# Patient Record
Sex: Female | Born: 1975 | Race: White | Hispanic: No | Smoking: Former smoker
Health system: Southern US, Community
[De-identification: ages and names within clinical notes are randomized; demographics above are authoritative.]

## PROBLEM LIST (undated history)

## (undated) DIAGNOSIS — R51 Headache: Secondary | ICD-10-CM

## (undated) DIAGNOSIS — F329 Major depressive disorder, single episode, unspecified: Secondary | ICD-10-CM

## (undated) DIAGNOSIS — R87619 Unspecified abnormal cytological findings in specimens from cervix uteri: Secondary | ICD-10-CM

## (undated) DIAGNOSIS — G43909 Migraine, unspecified, not intractable, without status migrainosus: Secondary | ICD-10-CM

## (undated) DIAGNOSIS — IMO0002 Reserved for concepts with insufficient information to code with codable children: Secondary | ICD-10-CM

## (undated) DIAGNOSIS — F32A Depression, unspecified: Secondary | ICD-10-CM

## (undated) DIAGNOSIS — B009 Herpesviral infection, unspecified: Secondary | ICD-10-CM

## (undated) DIAGNOSIS — M1990 Unspecified osteoarthritis, unspecified site: Secondary | ICD-10-CM

## (undated) HISTORY — DX: Migraine, unspecified, not intractable, without status migrainosus: G43.909

## (undated) HISTORY — PX: OTHER SURGICAL HISTORY: SHX169

---

## 1997-03-21 ENCOUNTER — Inpatient Hospital Stay (HOSPITAL_COMMUNITY): Admission: AD | Admit: 1997-03-21 | Discharge: 1997-03-23 | Payer: Self-pay | Admitting: *Deleted

## 1997-03-21 ENCOUNTER — Encounter (HOSPITAL_COMMUNITY): Admission: RE | Admit: 1997-03-21 | Discharge: 1997-03-22 | Payer: Self-pay | Admitting: *Deleted

## 1997-04-21 ENCOUNTER — Encounter (HOSPITAL_COMMUNITY): Admission: RE | Admit: 1997-04-21 | Discharge: 1997-07-20 | Payer: Self-pay | Admitting: *Deleted

## 1999-07-28 ENCOUNTER — Inpatient Hospital Stay (HOSPITAL_COMMUNITY): Admission: AD | Admit: 1999-07-28 | Discharge: 1999-07-31 | Payer: Self-pay | Admitting: Obstetrics & Gynecology

## 1999-09-07 ENCOUNTER — Other Ambulatory Visit: Admission: RE | Admit: 1999-09-07 | Discharge: 1999-09-07 | Payer: Self-pay | Admitting: Obstetrics & Gynecology

## 2002-11-26 ENCOUNTER — Encounter (INDEPENDENT_AMBULATORY_CARE_PROVIDER_SITE_OTHER): Payer: Self-pay | Admitting: *Deleted

## 2002-11-26 ENCOUNTER — Encounter: Admission: RE | Admit: 2002-11-26 | Discharge: 2002-11-26 | Payer: Self-pay | Admitting: Obstetrics and Gynecology

## 2004-05-18 ENCOUNTER — Encounter (INDEPENDENT_AMBULATORY_CARE_PROVIDER_SITE_OTHER): Payer: Self-pay | Admitting: Specialist

## 2004-05-18 ENCOUNTER — Ambulatory Visit: Payer: Self-pay | Admitting: Obstetrics and Gynecology

## 2004-06-22 ENCOUNTER — Other Ambulatory Visit: Admission: RE | Admit: 2004-06-22 | Discharge: 2004-06-22 | Payer: Self-pay | Admitting: Obstetrics and Gynecology

## 2004-06-22 ENCOUNTER — Ambulatory Visit: Payer: Self-pay | Admitting: Obstetrics and Gynecology

## 2004-07-06 ENCOUNTER — Ambulatory Visit: Payer: Self-pay | Admitting: Obstetrics and Gynecology

## 2004-12-16 ENCOUNTER — Encounter (INDEPENDENT_AMBULATORY_CARE_PROVIDER_SITE_OTHER): Payer: Self-pay | Admitting: *Deleted

## 2004-12-16 ENCOUNTER — Ambulatory Visit: Payer: Self-pay | Admitting: Family Medicine

## 2005-02-18 ENCOUNTER — Ambulatory Visit: Payer: Self-pay | Admitting: Family Medicine

## 2005-03-25 ENCOUNTER — Ambulatory Visit: Payer: Self-pay | Admitting: Gynecology

## 2005-05-25 ENCOUNTER — Ambulatory Visit: Payer: Self-pay | Admitting: Obstetrics & Gynecology

## 2005-10-13 ENCOUNTER — Ambulatory Visit: Payer: Self-pay | Admitting: Obstetrics & Gynecology

## 2006-03-10 ENCOUNTER — Encounter (INDEPENDENT_AMBULATORY_CARE_PROVIDER_SITE_OTHER): Payer: Self-pay | Admitting: Specialist

## 2006-03-10 ENCOUNTER — Encounter: Payer: Self-pay | Admitting: Obstetrics and Gynecology

## 2006-03-10 ENCOUNTER — Ambulatory Visit: Payer: Self-pay | Admitting: Obstetrics & Gynecology

## 2006-04-05 ENCOUNTER — Ambulatory Visit: Payer: Self-pay | Admitting: Obstetrics and Gynecology

## 2006-08-30 ENCOUNTER — Encounter: Payer: Self-pay | Admitting: Obstetrics and Gynecology

## 2006-08-30 ENCOUNTER — Ambulatory Visit: Payer: Self-pay | Admitting: Obstetrics and Gynecology

## 2007-01-16 ENCOUNTER — Ambulatory Visit: Payer: Self-pay | Admitting: Obstetrics and Gynecology

## 2007-09-06 ENCOUNTER — Inpatient Hospital Stay (HOSPITAL_COMMUNITY): Admission: AD | Admit: 2007-09-06 | Discharge: 2007-09-06 | Payer: Self-pay | Admitting: Obstetrics and Gynecology

## 2007-09-21 ENCOUNTER — Inpatient Hospital Stay (HOSPITAL_COMMUNITY): Admission: AD | Admit: 2007-09-21 | Discharge: 2007-09-23 | Payer: Self-pay | Admitting: Obstetrics and Gynecology

## 2008-11-13 ENCOUNTER — Encounter: Payer: Self-pay | Admitting: Obstetrics and Gynecology

## 2008-11-13 ENCOUNTER — Ambulatory Visit: Payer: Self-pay | Admitting: Obstetrics and Gynecology

## 2010-01-17 NOTE — L&D Delivery Note (Signed)
Delivery Note At 2:36 PM a viable female was preciptously delivered by the RN.  MD here for placental delivery and cord blood collection. APGAR: 8, 9; weight 6 lb 9.6 oz (2995 g).   Placenta status: delivered , .  Cord: 3 vessels with the following complications: None.  Anesthesia:  none Episiotomy: none   Lacerations: none Suture Repair: N/A Est. Blood Loss (mL): 500  Mom to postpartum.  Baby to nursery-stable.  BOVARD,Amaiah Cristiano 01/06/2011, 2:58 PM  Br/Mirena IUD/O+/RI

## 2010-05-26 ENCOUNTER — Inpatient Hospital Stay (HOSPITAL_COMMUNITY)
Admission: AD | Admit: 2010-05-26 | Discharge: 2010-05-27 | Disposition: A | Discharge status: Home / Self Care | Payer: Medicaid Other | Source: Ambulatory Visit | Attending: Family Medicine | Admitting: Family Medicine

## 2010-05-26 ENCOUNTER — Inpatient Hospital Stay (HOSPITAL_COMMUNITY): Payer: Medicaid Other

## 2010-05-26 DIAGNOSIS — R197 Diarrhea, unspecified: Secondary | ICD-10-CM | POA: Insufficient documentation

## 2010-05-26 DIAGNOSIS — O99891 Other specified diseases and conditions complicating pregnancy: Secondary | ICD-10-CM | POA: Insufficient documentation

## 2010-05-26 DIAGNOSIS — R109 Unspecified abdominal pain: Secondary | ICD-10-CM | POA: Insufficient documentation

## 2010-05-26 DIAGNOSIS — O9989 Other specified diseases and conditions complicating pregnancy, childbirth and the puerperium: Secondary | ICD-10-CM

## 2010-05-26 LAB — POCT PREGNANCY, URINE: Preg Test, Ur: POSITIVE

## 2010-05-26 LAB — ABO/RH: ABO/RH(D): O POS

## 2010-05-26 LAB — HEMOCCULT GUIAC POC 1CARD (OFFICE): Fecal Occult Bld: NEGATIVE

## 2010-05-26 LAB — URINALYSIS, ROUTINE W REFLEX MICROSCOPIC
Ketones, ur: NEGATIVE mg/dL
Protein, ur: NEGATIVE mg/dL
Urobilinogen, UA: 1 mg/dL (ref 0.0–1.0)

## 2010-05-26 LAB — CBC
Hemoglobin: 12.6 g/dL (ref 12.0–15.0)
MCH: 30.6 pg (ref 26.0–34.0)
MCHC: 34.1 g/dL (ref 30.0–36.0)
RDW: 13.2 % (ref 11.5–15.5)

## 2010-05-26 LAB — WET PREP, GENITAL
Trich, Wet Prep: NONE SEEN
Yeast Wet Prep HPF POC: NONE SEEN

## 2010-05-26 LAB — HCG, QUANTITATIVE, PREGNANCY: hCG, Beta Chain, Quant, S: 15823 m[IU]/mL — ABNORMAL HIGH (ref <5)

## 2010-05-27 LAB — GC/CHLAMYDIA PROBE AMP, GENITAL: Chlamydia, DNA Probe: NEGATIVE

## 2010-06-01 NOTE — Group Therapy Note (Signed)
NAMEKRISLYN, DONNAN NO.:  [id number]   MEDICAL RECORD NO.:  [id number]          PATIENT TYPE:  WOC   LOCATION:  [facility]                   FACILITY:  [facility]   PHYSICIAN:  Argentina Donovan, MD        DATE OF BIRTH:  [birth year]   DATE OF SERVICE:  08/30/2006                                  CLINIC NOTE   The patient is a 35 year old Caucasian female, gravida 3, para 3-0-0-3,  who has been on Wellbutrin and Yaz for contraception and for severe PMS.  She has also found that she was able to cut down on her smoking  significantly and smokes only occasionally a cigarette since she has  been on the Wellbutrin.  She has had severe PMDD and seems to be coping  very well now.  Has no significant complaints.  In going through her  chart, we noticed that she had had a Pap smear in March that showed  severe high-grade intraepithelial lesion, CIN 2-3.  She never came for  colposcopy because she had one in the past and said the pain was just  too great, so today we are re-Paping her and I told her if this comes  back abnormal, we will go ahead and do a LEEP procedure in the hospital.  We will try, since she does work full-time, schedule that on Friday  afternoon on call so that she does not have to take off more than half a  day or a day's work and that way she would avoid the pain and we may  resolve her problem completely.  She seems to like this and agree with  it and if it comes back abnormal, I will give her a phone call.   On examination, external genitalia is normal.  BUS within normal limits.  Vagina is clean and well rugated.  Cervix is clean and parous.  Uterus  is retroverted, first degree, of normal size, shape, consistency.  Adnexa is normal.  Rectum has no masses. Abdomen is soft, nontender, no  masses or organomegaly.   IMPRESSION:  Severe cervical dysplasia.  If persistent, plan loop  electrical excisional procedure.           ______________________________  Argentina Donovan, MD     PR/MEDQ  D:  08/30/2006  T:  08/31/2006  Job:  [id number]

## 2010-06-01 NOTE — Discharge Summary (Signed)
[name], [name]             ACCOUNT NO.:  [id number]   MEDICAL RECORD NO.:  [id number]          PATIENT TYPE:  INP   LOCATION:  9120                          [facility]   PHYSICIAN:  Malachi Pro. Ambrose Mantle, M.D. DATE OF BIRTH:  Feb 22, 1975   DATE OF ADMISSION:  09/21/2007  DATE OF DISCHARGE:  09/23/2007                               DISCHARGE SUMMARY   A 35 year old white single female, para 3-0-0-3, gravida 4, and [facility]  September 23, 2007, admitted with premature rupture of the membranes at  approximately 7:00 a.m. on September 21, 2007.  Blood group and type, O  positive; negative antibody; nonreactive serology; rubella immune;  hepatitis B surface antigen negative; HIV negative; GC and chlamydia  negative; declined first and second trimester screens; 1-hour Glucola  81; and group B strep negative.  Vaginal ultrasound on February 16, 2007;  8 weeks 5 days and [facility] September 23, 2007.  Ultrasound on March 13, 2007; crown-rump length 6.48 cm, 12 weeks 5 days, and [facility] September 19, 2007.  Ultrasound on April 25, 2007; gestational age [redacted] weeks 2 days and  [facility] September 23, 2007.  The patient began Wellbutrin on June 2009.  She  began Valtrex on August 21, 2007.  At 7:00 a.m. on the day of admission,  the patient had spontaneous rupture of membranes with clear fluid.  She  came to our office.  Crist Fat was positive.  She was admitted and begun on  Pitocin, at 12 mU/minute decelerations began.   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   ILLNESSES:  HSV, depression, and migraines.   OPERATIONS:  None.   SOCIAL HISTORY:  Alcohol, tobacco, and drugs none.   FAMILY HISTORY:  Father with anxiety and depression and diabetes.  Mother with thyroid disease.  Brother with diabetes.   PHYSICAL EXAMINATION:  VITAL SIGNS:  On admission revealed normal vital  signs.  HEART:  Normal.  LUNGS:  Normal.  ABDOMEN:  Soft and nontender.  Term-size fundus.  Fetal heart tones  normal, except for  decelerations that corrected and then recurred.  PELVIC:  The cervix was 1-cm long vertex at a -3.   ADMITTING IMPRESSIONS:  Intrauterine pregnancy at 40 weeks and premature  rupture of the membranes.  The task was to try to get a dose of Pitocin  that would allow labor without decelerations.  At 10:30 p.m., the  Pitocin was at 3 mU/minute.  Contractions were every 4 minutes.  Cervix  was a loose fingertip, 50% vertex at a -2 to -3.  Decelerations  continued to occur, so Pitocin was discontinued after the patient began  to dilate.  She was 5 cm.  I was called to review her strep and when the  nurse inserted a fetal scalp electrode, the cervix was 7 cm.  Four  minutes later, the patient delivered spontaneously OA over bilateral  labial and small first-degree perineal lacerations, a living female  infant 5 pounds and 9 ounces, Apgars of 8 at 1 and 9 at 5 minutes, and  Dr. Ambrose Mantle was in attendance.  The placenta had a very  long cord at  least 50 inches and was removed intact.  A few membranes were removed  with my fingers.  The uterus was normal.  All lacerations were  superficial and hemostatic.  The patient did not want stitches, so no  repair was done.  Blood loss about 400 mL.  Postpartum, the patient did  quite well and was ready for discharge on the first postpartum day.  Initial hemoglobin 11.5, hematocrit 34.5, white count 13,600, and  platelet count 178,000.  Followup hemoglobin 10.6.  RPR nonreactive.   FINAL DIAGNOSES:  Intrauterine pregnancy at 40 weeks; delivered OA,  premature rupture of the membranes.   OPERATION:  Spontaneous delivery OA.   FINAL CONDITION:  Improved.   INSTRUCTIONS:  Our regular discharge instruction booklet.  Motrin 600  mg, 30 tablets 1 every 6 hours as needed for pain is given at discharge.  The patient was seen in consultation by the Department of Social  Services.  Because of her history of depression and the Department of  Social Services,  representative felt that the patient was fine.  The  patient is discharged on Wellbutrin 150 mg daily.  Continue her prenatal  vitamins and take the Motrin as needed.  Return in 6 weeks.  She has  agreed not to have intercourse for 6 weeks and at that time, she can  have the Mirena inserted.      Malachi Pro. Ambrose Mantle, M.D.  Electronically Signed     TFH/MEDQ  D:  09/23/2007  T:  09/23/2007  Job:  [id number]

## 2010-06-04 NOTE — Group Therapy Note (Signed)
[name], WANN NO.:  [id number]   MEDICAL RECORD NO.:  [id number]          PATIENT TYPE:  WOC   LOCATION:  [facility]                   [facility]ACILITY:  [facility]   PHYSICIAN:  Dorthula Perfect, MD     DATE OF BIRTH:  09/02/1975   DATE OF SERVICE:                                    CLINIC NOTE   REASON FOR VISIT:  Followup for her PMDD.  Also in need of a Pap.   HISTORY:  A 35 year old G3 who we have been following for her PMDD symptoms  and providing contraception.  This is a patient who has had symptomatology  ongoing for some time, which goes along with PMDD, including moodiness,  headaches, depression occurring like clockwork about four days before onset  of menses.  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  She  has a boyfriend, same partner for one year.  Declines STI testing today.   In the past, she had been on Celexa and was unhappy with that.  Also did not  like NuvaRing.  Since she was last seen here by Dr. Penne Lash, she tried the  Lexapro for 2-3 days and quit because of severe headache.  She states that  for the last cycle, she was laid up and bedridden for four days with a  severe headache.  She said she tried the Lexapro on two occasions and each  time got a severe headache which resolved in a few days.  She understands  that she has not had an adequate trial of Lexapro and does not want to  continue with it.  She took Loestrin for about a month and felt that she was  more moody than usual, so she switched back to her Ortho-Novum 135 and  felt fine on that, in terms of headache.  However, she has been doing a  lot of research on PMDD and has taken a questionnaire on the Internet and  has read about various treatment options and today, she is asking to try Yaz  and Zoloft.  She states that she had been on Zoloft six years ago for  similar symptoms and had little  effect from it at that time.  Also of note,  she smokes more than a pack a day and does relate that her headaches are  better when she smokes less.   Her LMP was October 09, 2005 and was normal.   PHYSICAL EXAMINATION:  VITAL SIGNS:  Above.  NECK:  Thyroid smooth, not enlarged.  HEART:  RRR without murmur.  LUNGS:  Clear to auscultation.  ABDOMEN:  Soft, flat, and nontender.  PELVIC:  NEFT.  Normal rugae.  Scant clear discharge.  Pap smear is done.  Cervix is parous without lesions.  Uterus is retroverted, normal size and  shape.  Rectovaginal confirms this.   ASSESSMENT:  Premenstrual dysphoric disorder, inadequately treated so far.   PLAN:  Counseled at length.  She does agree to try to decrease smoking and  keep a history of  symptoms and try taking prophylactic Tylenol since her  headache is so predictable.  In addition, she will try to do some stress  reduction techniques, including baby-sitters, date night, relaxation  techniques.  We will go ahead and start her on Yaz.  She is given two  samples of this to try for at least two months.  She is cautioned that  breakthrough bleeding  can be normal at the start of low dose __________ contraceptives.  She is  also given a prescription for Zoloft 50 mg 1 p.o. daily in consultation with  Dr. Perlie Gold.  She is to return to clinic in about eight weeks.     ______________________________  Caren Griffins, CNM    ______________________________  Dorthula Perfect, MD    DP/MEDQ  D:  10/13/2005  T:  10/15/2005  Job:  [id number]

## 2010-06-04 NOTE — Group Therapy Note (Signed)
NAMEWINSLOW, VERRILL NO.:  [id number]   MEDICAL RECORD NO.:  [id number]          PATIENT TYPE:  WOC   LOCATION:  [facility]                   FACILITY:  [facility]   PHYSICIAN:  Tinnie Gens, MD        DATE OF BIRTH:  Oct 20, 1975   DATE OF SERVICE:  02/18/2005                                    CLINIC NOTE   CHIEF COMPLAINT:  Mood swings, depression.   HISTORY OF PRESENT ILLNESS:  Patient is a 35 year old gravida 3, para 3 who  uses the NuvaRing for birth control.  She has had history of anhedonia and  poor mood.  She questions whether she has any history of mania.  The patient  has tried on several occasions to be seen at mental health and adult family  services, but has taken too long to be seen there.  She also has a history  of postpartum depression, was on Zoloft previously and says this did not  really work for her, although she was not prescribed this by a doctor and is  not clear whether she was on appropriate dose.   The patient also has a family history of volatility, depression, and crazy  people in her family.  The patient denies current suicidal or homicidal  ideations, although she reports thinking of these things in the past.  She  denies auditory or visual hallucinations as well.   PHYSICAL EXAMINATION:  GENERAL:  She has a flat affect.  She is a well-  developed, well-nourished white female in no acute distress.  ABDOMEN:  Soft, nontender, nondistended.  NEUROLOGIC:  She does not appear to be responding to any internal stimuli.  She is tearful during the interview.   IMPRESSION:  Depression, question bipolar disease.   PLAN:  Paroxetine 20 mg one-half p.o. daily x1 week, then one p.o. daily #30  with four refills given.  Discussed suicidal/homicidal ideation, when the  patient needs to be admitted or evaluated for these symptoms.  Also  discussed mania and being seen at mental health.  Patient is given number  for this and told to follow up with  them as necessary.  She will follow up  here in four weeks to see how she is doing.           ______________________________  Tinnie Gens, MD     TP/MEDQ  D:  02/18/2005  T:  02/18/2005  Job:  [id number]

## 2010-06-04 NOTE — Group Therapy Note (Signed)
[name] NO.:  [id number]   MEDICAL RECORD NO.:  [id number]          PATIENT TYPE:  WOC   LOCATION:  [facility]                   [facility]:  [facility]   PHYSICIAN:  Tinnie Gens, MD        DATE OF BIRTH:  [birth year]   DATE OF SERVICE:                                    CLINIC NOTE   CHIEF COMPLAINT:  Followup Pap smear.   HISTORY OF PRESENT ILLNESS:  Patient is a 35 year old gravida 3, para 3, who  currently uses NuvaRing for birth control, who has a history of an abnormal  Pap.  She underwent colposcopy last in June of this year and was found to  have CIN 1.  Again, she is here for followup Pap.  She is without complaints  today except for a sweet taste in her mouth, which she thinks is associated  with the use of the NuvaRing, but she cannot find anywhere where that is  listed as a possible side effect of that.  She is having regular cycles.   PHYSICAL EXAMINATION:  VITAL SIGNS: Her vitals are as noted in chart.  GENERAL: She is a well-developed, well-nourished white female in no acute  distress.  ABDOMEN:  Soft, nontender, nondistended.  GENITOURINARY:  Normal external female genitalia.  The vagina is pink and  rugated.  The cervix is parous and without lesion.  The uterus was small,  anteverted.  No adnexal mass or tenderness.   IMPRESSION:  1.  History of cervical intra-epithelial neoplasia 1.  2.  Birth control consult.   PLAN:  1.  Pap smear today.  2.  Continue NuvaRing.  I have advised her about [facility], as well as giving their permission by IUD placement.           ______________________________  Tinnie Gens, MD     TP/MEDQ  D:  12/16/2004  T:  12/16/2004  Job:  [id number]

## 2010-06-04 NOTE — Group Therapy Note (Signed)
[name], [name] NO.:  [id number]   MEDICAL RECORD NO.:  [id number]          PATIENT TYPE:  WOC   LOCATION:  [facility]                   [facility]   PHYSICIAN:  Elsie Lincoln, MD      DATE OF BIRTH:  05/01/1975   DATE OF SERVICE:                                    CLINIC NOTE   Patient is a 35 year old female who presents in followup for depression and  birth control.  She is also to have her Pap smear today for followup of low  grade SIL; however, she is on her period.   She has depression that has been refractory to Zoloft and Paxil and was  recently started on Celexa, but she had daily headaches with that, so she  stopped after a week.  She believes that she is bipolar.  She went to  behavioral health one time, and they believe that she just has depression  and needs to get over it. I suggested that she go back to behavioral health  and reiterate one other time that she believes she is bipolar and has rapid  mood swings, and she needs a mood stabilizer.  This is out of my realm of  expertise to start a mood stabilizer; however, I do believe that Lexapro  does have some antianxiety attributes and could be good for her, so I will  start her on Lexapro 10 mg p.o. daily, and I will give her samples for one  month, as she has no insurance.  Also, I think that since she has mood  swings and PMS/PMDD, that putting her on a 24/4 pill would keep the ovary  more quiescent and maybe help some of the moodiness with her period, so  Loestrin FE 24 sample was given today and prescription as well.  Patient is  to come back in six weeks to see how she is doing on these new medications  and also for Pap smear.           ______________________________  Elsie Lincoln, MD     KL/MEDQ  D:  05/25/2005  T:  05/26/2005  Job:  [id number]

## 2010-06-04 NOTE — Group Therapy Note (Signed)
   [name], [name]                       ACCOUNT NO.:  [id number]   MEDICAL RECORD NO.:  [id number]                   PATIENT TYPE:  OUT   LOCATION:  [facility]                           FACILITY:  [facility]   PHYSICIAN:  Argentina Donovan, MD                     DATE OF BIRTH:  1975-12-08   DATE OF SERVICE:  11/26/2002                                    CLINIC NOTE   REASON FOR VISIT:  The patient is a 35 year old white female gravida 3, para  3-0-0-3 who was referred one year ago for a LEEP procedure for a CIN-2  moderate dysplasia by the health department.  The patient never came because  she chickened out.  She had heard from a cousin that it was a terribly  painful procedure.  So she comes in today for a follow up, and Pap smear was  done and we will decide what to do once that comes back.  The patient is a  very occasional smoker.  Smokes several cigarettes a week she says.  Had  colposcopy with biopsy last year which showed CIN-2 moderate dysplasia.   PHYSICAL EXAMINATION:  PELVIC:  External genitalia is normal.  BUS within  normal limits.  The vagina is clean and well rugated.  The cervix is parous,  clean, and anterior, slightly hypertrophied and the uterus is posterior,  retroverted, first-degree, with normal cul-de-sac and normal adnexa.   IMPRESSION:  Previous cervical intraepithelial neoplasia-2 not treated for  repeat Pap smear and decision on treatment.                                               Argentina Donovan, MD    PR/MEDQ  D:  11/26/2002  T:  11/26/2002  Job:  [id number]

## 2010-06-04 NOTE — Group Therapy Note (Signed)
[name], MONTERROSO NO.:  [id number]   MEDICAL RECORD NO.:  [id number]          PATIENT TYPE:  WOC   LOCATION:  [facility]                   [facility]   PHYSICIAN:  Ginger Carne, MD DATE OF BIRTH:  Dec 05, 1975   DATE OF SERVICE:                                    CLINIC NOTE   CHIEF COMPLAINT:  History of depression.   HISTORY OF PRESENT ILLNESS:  This is a 35 year old Caucasian female,  multiparous, who was placed on paroxetine 20 mg 1 daily on February 18, 2005  to deal with anxiety/depression problems.  The patient states that she noted  onset of depression midway during her last pregnancy.  She reports today  that she does not see appreciable difference in her feelings of depression  and fatigue, and still has episodes of anxiety.  The patient adds that in  2001, she had been placed on Zoloft and did not feel that it was helpful.  The patient indicated that she was unsure of the dose.  She has no  additional stressors in her life apart from raising her children and the  daily issues that have been with her over the past several years.   PHYSICAL EXAMINATION:  The patient appears to be communicative and healthy.   IMPRESSION:  Depressive anxiety disorder.   PLAN:  I discussed with the patient that 4 weeks is not sufficient time to  have a complete appraisal regarding the use of any antidepressant  particularly in the SSRI group.  However, I have given her the benefit of  the doubt and have tapered her off Paxil taking a tablet every other day  over 10 days, and started Celexa at 20 mg h.s. for 1 week and then 40 mg  h.s. thereafter.  She will return in 4-5 weeks.  I explained to the patient  that different antidepressants have different effects on individuals.  The  other possibility adding to her depression may be the use of an estrogen-  progesterone contraceptive agent.  She wanted to stop the NuvaRing because  of the effect it has on her  boyfriend.  She was started on the generic form  of Ortho-Novum 1/35 and will start these when she has completed this cycle  of her NuvaRing.           ______________________________  Ginger Carne, MD     SHB/MEDQ  D:  03/25/2005  T:  03/26/2005  Job:  [id number]

## 2010-08-30 ENCOUNTER — Inpatient Hospital Stay (HOSPITAL_COMMUNITY)
Admission: AD | Admit: 2010-08-30 | Discharge: 2010-08-31 | Disposition: A | Discharge status: Home / Self Care | Payer: Medicaid Other | Source: Ambulatory Visit | Attending: Obstetrics & Gynecology | Admitting: Obstetrics & Gynecology

## 2010-08-30 DIAGNOSIS — O0930 Supervision of pregnancy with insufficient antenatal care, unspecified trimester: Secondary | ICD-10-CM | POA: Insufficient documentation

## 2010-08-30 DIAGNOSIS — N898 Other specified noninflammatory disorders of vagina: Secondary | ICD-10-CM

## 2010-08-30 DIAGNOSIS — B9689 Other specified bacterial agents as the cause of diseases classified elsewhere: Secondary | ICD-10-CM

## 2010-08-30 DIAGNOSIS — A499 Bacterial infection, unspecified: Secondary | ICD-10-CM | POA: Insufficient documentation

## 2010-08-30 DIAGNOSIS — O2390 Unspecified genitourinary tract infection in pregnancy, unspecified trimester: Secondary | ICD-10-CM | POA: Insufficient documentation

## 2010-08-30 DIAGNOSIS — N760 Acute vaginitis: Secondary | ICD-10-CM | POA: Insufficient documentation

## 2010-08-31 ENCOUNTER — Encounter (HOSPITAL_COMMUNITY): Payer: Self-pay | Admitting: *Deleted

## 2010-08-31 LAB — URINALYSIS, ROUTINE W REFLEX MICROSCOPIC
Glucose, UA: NEGATIVE mg/dL
Ketones, ur: NEGATIVE mg/dL
Leukocytes, UA: NEGATIVE
Nitrite: NEGATIVE
Protein, ur: NEGATIVE mg/dL

## 2010-08-31 LAB — WET PREP, GENITAL: Yeast Wet Prep HPF POC: NONE SEEN

## 2010-08-31 MED ORDER — METRONIDAZOLE 500 MG PO TABS: 500.0000 mg | ORAL_TABLET | Freq: Two times a day (BID) | ORAL | Status: DC

## 2010-08-31 MED ORDER — METRONIDAZOLE 500 MG PO TABS: 500.0000 mg | ORAL_TABLET | Freq: Two times a day (BID) | ORAL | Status: AC

## 2010-08-31 NOTE — Progress Notes (Signed)
SAYS NOTICED GREEN D/C AT  5PM THEN AFTERWARDS AT 830PM.   NO MORE SINCE.

## 2010-08-31 NOTE — ED Provider Notes (Signed)
Chief Complaint  Patient presents with  . Vaginal Discharge   S:  [name] is a 35 y.o. [id number] at @19  3/7 wks with onset tonight of  green vaginal discharge. Denies irritation, itch or malodor. No antecedent inertourse. No vaginal bleeding or LOF. Good FM. Urinary frequency today but denies dysuria, urgency, hematuria. No PNC but plans to start with Dr. Ambrose Mantle when [facility] comes through.   O:  Filed Vitals:   08/31/10 0019  BP: 111/57  Pulse: 81  Temp: 98.1 F (36.7 C)  Resp: 20   FHR 150 DT Abd: soft, NT, S=D Spec: Small amt thick grayish discharge. No blood. Vag and cx clean. Cx: L/C/H Results for orders placed during the hospital encounter of 08/30/10 (from the past 24 hour(s))  WET PREP, GENITAL     Status: Abnormal   Collection Time   08/31/10 12:32 AM      Component Value Range   Yeast, Wet Prep NONE SEEN  NONE SEEN    Trich, Wet Prep NONE SEEN  NONE SEEN    Clue Cells, Wet Prep MODERATE (*) NONE SEEN    WBC, Wet Prep HPF POC MODERATE (*) NONE SEEN    A/P:  BV. Rx Flagyl. Stop ibuprofen. Start [facility] w/Dr. Ambrose Mantle ASAP

## 2010-08-31 NOTE — Progress Notes (Signed)
DIEDRE, CNM AT BEDSIDE

## 2010-09-15 LAB — ANTIBODY SCREEN: Antibody Screen: NEGATIVE

## 2010-09-15 LAB — RUBELLA ANTIBODY, IGM: Rubella: IMMUNE

## 2010-10-20 LAB — CBC
HCT: 34.5 — ABNORMAL LOW
Hemoglobin: 10.6 — ABNORMAL LOW
Hemoglobin: 11.5 — ABNORMAL LOW
MCHC: 33.4
MCV: 91.7
RBC: 3.47 — ABNORMAL LOW
RBC: 3.76 — ABNORMAL LOW
WBC: 11.9 — ABNORMAL HIGH
WBC: 13.6 — ABNORMAL HIGH

## 2010-10-22 LAB — POCT PREGNANCY, URINE
Operator id: 297281
Preg Test, Ur: POSITIVE

## 2010-10-22 LAB — POCT URINALYSIS DIP (DEVICE)
Glucose, UA: NEGATIVE
Hgb urine dipstick: NEGATIVE
Ketones, ur: NEGATIVE
Operator id: 297281
Specific Gravity, Urine: >=1.03
Urobilinogen, UA: 0.2

## 2010-12-16 LAB — STREP B DNA PROBE: GBS: NEGATIVE

## 2011-01-06 ENCOUNTER — Encounter (HOSPITAL_COMMUNITY): Payer: Self-pay | Admitting: *Deleted

## 2011-01-06 ENCOUNTER — Inpatient Hospital Stay (HOSPITAL_COMMUNITY)
Admission: AD | Admit: 2011-01-06 | Discharge: 2011-01-07 | DRG: 775 | Disposition: A | Discharge status: Home / Self Care | Payer: Medicaid Other | Source: Ambulatory Visit | Attending: Obstetrics and Gynecology | Admitting: Obstetrics and Gynecology

## 2011-01-06 DIAGNOSIS — O4290 Premature rupture of membranes, unspecified as to length of time between rupture and onset of labor, unspecified weeks of gestation: Secondary | ICD-10-CM

## 2011-01-06 HISTORY — DX: Depression, unspecified: F32.A

## 2011-01-06 HISTORY — DX: Major depressive disorder, single episode, unspecified: F32.9

## 2011-01-06 HISTORY — DX: Headache: R51

## 2011-01-06 HISTORY — DX: Unspecified abnormal cytological findings in specimens from cervix uteri: R87.619

## 2011-01-06 HISTORY — DX: Reserved for concepts with insufficient information to code with codable children: IMO0002

## 2011-01-06 HISTORY — DX: Herpesviral infection, unspecified: B00.9

## 2011-01-06 LAB — CBC
HCT: 36.8 % (ref 36.0–46.0)
Hemoglobin: 12.5 g/dL (ref 12.0–15.0)
MCHC: 34 g/dL (ref 30.0–36.0)
RBC: 4.02 MIL/uL (ref 3.87–5.11)
WBC: 14.5 10*3/uL — ABNORMAL HIGH (ref 4.0–10.5)

## 2011-01-06 LAB — RPR: RPR Ser Ql: NONREACTIVE

## 2011-01-06 MED ORDER — IBUPROFEN 600 MG PO TABS
600.0000 mg | ORAL_TABLET | Freq: Four times a day (QID) | ORAL | Status: DC
  Administered 2011-01-06 – 2011-01-07 (×3): 600 mg via ORAL
  Filled 2011-01-06 (×3): qty 1

## 2011-01-06 MED ORDER — SIMETHICONE 80 MG PO CHEW: 80.0000 mg | CHEWABLE_TABLET | ORAL | Status: DC | PRN

## 2011-01-06 MED ORDER — WITCH HAZEL-GLYCERIN EX PADS: 1.0000 "application " | MEDICATED_PAD | CUTANEOUS | Status: DC | PRN

## 2011-01-06 MED ORDER — OXYTOCIN BOLUS FROM INFUSION
500.0000 mL | Freq: Once | INTRAVENOUS | Status: DC
  Filled 2011-01-06: qty 500

## 2011-01-06 MED ORDER — PRENATAL MULTIVITAMIN CH: 1.0000 | ORAL_TABLET | Freq: Every day | ORAL | Status: DC

## 2011-01-06 MED ORDER — CITRIC ACID-SODIUM CITRATE 334-500 MG/5ML PO SOLN
30.0000 mL | ORAL | Status: DC | PRN
  Administered 2011-01-06: 30 mL via ORAL
  Filled 2011-01-06: qty 15

## 2011-01-06 MED ORDER — OXYCODONE-ACETAMINOPHEN 5-325 MG PO TABS: 1.0000 | ORAL_TABLET | ORAL | Status: DC | PRN

## 2011-01-06 MED ORDER — DIPHENHYDRAMINE HCL 25 MG PO CAPS: 25.0000 mg | ORAL_CAPSULE | Freq: Four times a day (QID) | ORAL | Status: DC | PRN

## 2011-01-06 MED ORDER — ZOLPIDEM TARTRATE 5 MG PO TABS: 5.0000 mg | ORAL_TABLET | Freq: Every evening | ORAL | Status: DC | PRN

## 2011-01-06 MED ORDER — IBUPROFEN 600 MG PO TABS
600.0000 mg | ORAL_TABLET | Freq: Four times a day (QID) | ORAL | Status: DC | PRN
  Administered 2011-01-06: 600 mg via ORAL
  Filled 2011-01-06: qty 1

## 2011-01-06 MED ORDER — LIDOCAINE HCL (PF) 1 % IJ SOLN
30.0000 mL | INTRAMUSCULAR | Status: DC | PRN
  Filled 2011-01-06: qty 30

## 2011-01-06 MED ORDER — DIBUCAINE 1 % RE OINT: 1.0000 "application " | TOPICAL_OINTMENT | RECTAL | Status: DC | PRN

## 2011-01-06 MED ORDER — ONDANSETRON HCL 4 MG/2ML IJ SOLN: 4.0000 mg | INTRAMUSCULAR | Status: DC | PRN

## 2011-01-06 MED ORDER — ONDANSETRON HCL 4 MG PO TABS: 4.0000 mg | ORAL_TABLET | ORAL | Status: DC | PRN

## 2011-01-06 MED ORDER — LACTATED RINGERS IV SOLN: INTRAVENOUS | Status: DC

## 2011-01-06 MED ORDER — TETANUS-DIPHTH-ACELL PERTUSSIS 5-2.5-18.5 LF-MCG/0.5 IM SUSP: 0.5000 mL | Freq: Once | INTRAMUSCULAR | Status: DC

## 2011-01-06 MED ORDER — BENZOCAINE-MENTHOL 20-0.5 % EX AERO: 1.0000 "application " | INHALATION_SPRAY | CUTANEOUS | Status: DC | PRN

## 2011-01-06 MED ORDER — LACTATED RINGERS IV SOLN: 500.0000 mL | INTRAVENOUS | Status: DC | PRN

## 2011-01-06 MED ORDER — ACETAMINOPHEN 325 MG PO TABS: 650.0000 mg | ORAL_TABLET | ORAL | Status: DC | PRN

## 2011-01-06 MED ORDER — TERBUTALINE SULFATE 1 MG/ML IJ SOLN: 0.2500 mg | Freq: Once | INTRAMUSCULAR | Status: DC | PRN

## 2011-01-06 MED ORDER — PRENATAL MULTIVITAMIN CH
1.0000 | ORAL_TABLET | Freq: Every day | ORAL | Status: DC
  Administered 2011-01-07: 1 via ORAL
  Filled 2011-01-06: qty 1

## 2011-01-06 MED ORDER — OXYTOCIN 20 UNITS IN LACTATED RINGERS INFUSION - SIMPLE
1.0000 m[IU]/min | INTRAVENOUS | Status: DC
  Administered 2011-01-06: 2 m[IU]/min via INTRAVENOUS
  Filled 2011-01-06: qty 1000

## 2011-01-06 MED ORDER — ONDANSETRON HCL 4 MG/2ML IJ SOLN: 4.0000 mg | Freq: Four times a day (QID) | INTRAMUSCULAR | Status: DC | PRN

## 2011-01-06 MED ORDER — OXYTOCIN 20 UNITS IN LACTATED RINGERS INFUSION - SIMPLE
125.0000 mL/h | Freq: Once | INTRAVENOUS | Status: AC
  Administered 2011-01-06: 999 mL/h via INTRAVENOUS

## 2011-01-06 MED ORDER — FLEET ENEMA 7-19 GM/118ML RE ENEM: 1.0000 | ENEMA | RECTAL | Status: DC | PRN

## 2011-01-06 MED ORDER — LACTATED RINGERS IV SOLN
INTRAVENOUS | Status: DC
  Administered 2011-01-06 (×2): via INTRAVENOUS

## 2011-01-06 MED ORDER — OXYCODONE-ACETAMINOPHEN 5-325 MG PO TABS: 2.0000 | ORAL_TABLET | ORAL | Status: DC | PRN

## 2011-01-06 MED ORDER — LANOLIN HYDROUS EX OINT: TOPICAL_OINTMENT | CUTANEOUS | Status: DC | PRN

## 2011-01-06 MED ORDER — SENNOSIDES-DOCUSATE SODIUM 8.6-50 MG PO TABS
2.0000 | ORAL_TABLET | Freq: Every day | ORAL | Status: DC
  Administered 2011-01-06: 2 via ORAL

## 2011-01-06 MED ORDER — BENZOCAINE-MENTHOL 20-0.5 % EX AERO
INHALATION_SPRAY | CUTANEOUS | Status: AC
  Filled 2011-01-06: qty 56

## 2011-01-06 NOTE — H&P (Signed)
[name] is a 35 y.o. female, G6 P4014, EGA [redacted] weeks with EDC 01-18-11 presenting for evaluation of leaking fluid since 2340 last pm.  Eval in MAU early this am confirmed ROM.  Pt admitted and was initially contracting well on her own.  She has received an epidural and ctx spaced out, recently started on pitocin.  Prenatal care essentially uncomplicated, established care late at about 22 weeks.  See prenatal records for complete history.  Maternal Medical History:  Reason for admission: Reason for admission: rupture of membranes.  Contractions: Frequency: irregular.    Fetal activity: Perceived fetal activity is normal.      OB History    Grav Para Term Preterm Abortions TAB SAB Ect Mult Living   6 4 4  1 1    4     SVD at term x 4, EAB  Past Medical History  Diagnosis Date  . Abnormal Pap smear   . Depression   . Headache   . HSV infection    Past Surgical History  Procedure Date  . No past surgeries    Family History: family history includes Bipolar disorder in her father; Diabetes in her brother; Heart disease in her mother; and Thyroid disease in her mother. Social History:  reports that she has been passively smoking.  She has never used smokeless tobacco. She reports that she does not drink alcohol or use illicit drugs.  Review of Systems  Respiratory: Negative.   Cardiovascular: Negative.     Dilation: 2 Effacement (%): 80 Station: -2 Exam by:: Isac Sarna, RN Blood pressure 108/64, pulse 69, temperature 98.4 F (36.9 C), temperature source Oral, resp. rate 20, height 5\' 5"  (1.651 m), weight 79.379 kg (175 lb), last menstrual period 04/13/2010, SpO2 98.00%. Maternal Exam:  Uterine Assessment: Contraction strength is moderate.  Contraction frequency is irregular.   Abdomen: Patient reports no abdominal tenderness. Estimated fetal weight is 7 lbs.   Fetal presentation: vertex  Introitus: Normal vulva. Normal vagina.  Ferning test: positive.  Amniotic fluid  character: clear.  Pelvis: adequate for delivery.   Cervix: Cervix evaluated by digital exam.     Fetal Exam Fetal Monitor Review: Mode: ultrasound.   Baseline rate: 110-120.  Variability: moderate (6-25 bpm).   Pattern: accelerations present and no decelerations.    Fetal State Assessment: Category I - tracings are normal.     Physical Exam  Constitutional: She appears well-developed and well-nourished.  Cardiovascular: Normal rate, regular rhythm and normal heart sounds.   No murmur heard. Respiratory: Effort normal and breath sounds normal. No respiratory distress. She has no wheezes.  GI: Soft.       Gravid     Prenatal labs: ABO, Rh: --/--/O POS (05/09 2220) Antibody: Negative (08/29 0000) Rubella: Immune (08/29 0000) RPR:   NR HBsAg: Negative (08/29 0000)  HIV: Non-reactive (08/29 0000)  GBS: Negative (11/29 0000)   Assessment/Plan: IUP at 38 weeks with PROM.  Comfortable with epidural, now on pitocin augmentation.  Monitor progress.  Dr. Ellyn Hack informed of pt status.     [name] D 01/06/2011, 7:24 AM

## 2011-01-06 NOTE — Progress Notes (Signed)
Pt states she broke her water at 2340

## 2011-01-06 NOTE — Progress Notes (Signed)
Pt presents to MAU for c/o ROM.  Happened at 1140 pm last night.  Had a large gush of clear fluid.

## 2011-01-06 NOTE — Progress Notes (Signed)
[name] is a 35 y.o. [phone] at [redacted]w[redacted]d admitted for rupture of membranes  Subjective: No c/o's  Objective: BP 110/68  Pulse 62  Temp(Src) 98.4 F (36.9 C) (Oral)  Resp 16  Ht 5\' 5"  (1.651 m)  Wt 79.379 kg (175 lb)  BMI 29.12 kg/m2  SpO2 98%  LMP 04/13/2010      FHT:  FHR: 145-150 bpm, variability: moderate,  accelerations:  Present,  decelerations:  Absent UC:   regular, every 4 minutes SVE:   Dilation: 2 Effacement (%): 80 Station: -2 Exam by:: Dr. Ellyn Hack  Labs: Lab Results  Component Value Date   WBC 14.5* 01/06/2011   HGB 12.5 01/06/2011   HCT 36.8 01/06/2011   MCV 91.5 01/06/2011   PLT 231 01/06/2011    Assessment / Plan: Spontaneous labor, progressing slowly, augmenting with pitocin  Labor: progressing slowly on pitocin, latent phase Preeclampsia:  no signs or symptoms of toxicity Fetal Wellbeing:  Category II Pain Control:  Labor support without medications and prn IV meds and epidural I/D:  n/a Anticipated MOD:  NSVD  BOVARD,Shivaan Tierno 01/06/2011, 9:01 AM

## 2011-01-06 NOTE — Progress Notes (Signed)
[name] is a 35 y.o. [phone] at [redacted]w[redacted]d admitted for rupture of membranes  Subjective: No c/o's, uncomf with ctx  Objective: BP 119/78  Pulse 79  Temp(Src) 97.9 F (36.6 C) (Oral)  Resp 20  Ht 5\' 5"  (1.651 m)  Wt 79.379 kg (175 lb)  BMI 29.12 kg/m2  SpO2 98%  LMP 04/13/2010      FHT:  FHR: 130 bpm, variability: moderate,  accelerations:  Present,  decelerations:  Absent UC:   regular, every 2-3 minutes SVE:   Dilation: 4 Effacement (%): 80 Station: -2;-1 Exam by:: L. Cone,RN,V. Ekundayo,Rn  Labs: Lab Results  Component Value Date   WBC 14.5* 01/06/2011   HGB 12.5 01/06/2011   HCT 36.8 01/06/2011   MCV 91.5 01/06/2011   PLT 231 01/06/2011   Pitocin @ 77mU/min  Assessment / Plan: Augmentation of labor, progressing well  Labor: Progressing normally Preeclampsia:  no signs or symptoms of toxicity Fetal Wellbeing:  Category II Pain Control:  Labor support without medications I/D:  n/a Anticipated MOD:  NSVD  BOVARD,Donovyn Guidice 01/06/2011, 1:40 PM

## 2011-01-06 NOTE — Progress Notes (Addendum)
Contractions palpated due to toco not tracing contractions. Contractions mild and irregular by palpation, toco frequently adjusted. Will continue to monitor.

## 2011-01-06 NOTE — Progress Notes (Signed)
Contractions every 5 minutes per patient. Few mild contractions palpated-toco adjusted. Will continue to monitor

## 2011-01-06 NOTE — Progress Notes (Signed)
Dr. Jackelyn Knife called and labor admit orders read back to MD.

## 2011-01-07 LAB — CBC
Hemoglobin: 11.4 g/dL — ABNORMAL LOW (ref 12.0–15.0)
MCHC: 33.5 g/dL (ref 30.0–36.0)
RBC: 3.69 MIL/uL — ABNORMAL LOW (ref 3.87–5.11)
WBC: 13.6 10*3/uL — ABNORMAL HIGH (ref 4.0–10.5)

## 2011-01-07 MED ORDER — PRENATAL MULTIVITAMIN CH: 1.0000 | ORAL_TABLET | Freq: Every day | ORAL | Status: DC

## 2011-01-07 MED ORDER — IBUPROFEN 800 MG PO TABS: 800.0000 mg | ORAL_TABLET | Freq: Four times a day (QID) | ORAL | Status: AC

## 2011-01-07 MED ORDER — OXYCODONE-ACETAMINOPHEN 5-325 MG PO TABS: 1.0000 | ORAL_TABLET | ORAL | Status: AC | PRN

## 2011-01-07 NOTE — Discharge Summary (Signed)
Obstetric Discharge Summary Reason for Admission: induction of labor Prenatal Procedures: none Intrapartum Procedures: spontaneous vaginal delivery Postpartum Procedures: none Complications-Operative and Postpartum: 2nd degree perineal laceration Hemoglobin  Date Value Range Status  01/07/2011 11.4* 12.0-15.0 (g/dL) Final     HCT  Date Value Range Status  01/07/2011 34.0* 36.0-46.0 (%) Final    Discharge Diagnoses: Term Pregnancy-delivered  Discharge Information: Date: 01/07/2011 Activity: pelvic rest Diet: routine Medications: PNV, Ibuprofen and Percocet Condition: stable Instructions: refer to practice specific booklet Discharge to: home Follow-up Information    Follow up with BOVARD,Sheriece Jefcoat, MD. Make an appointment in 6 weeks.   Contact information:   [address] [facility] [address] [address] [place] [address] [phone]          Newborn Data: Live born female  Birth Weight: 6 lb 9.6 oz (2995 g) APGAR: 8, 9  Home with mother.  BOVARD,Areebah Meinders 01/07/2011, 9:31 AM

## 2011-01-07 NOTE — Progress Notes (Signed)
UR chart review completed.  

## 2011-01-07 NOTE — Progress Notes (Signed)
Post Partum Day 1 Subjective: no complaints, up ad lib, voiding and tolerating PO  Objective: Blood pressure 101/67, pulse 71, temperature 98.1 F (36.7 C), temperature source Oral, resp. rate 18, height 5\' 5"  (1.651 m), weight 79.379 kg (175 lb), last menstrual period 04/13/2010, SpO2 98.00%, unknown if currently breastfeeding.  Physical Exam:  General: alert and no distress Lochia: appropriate Uterine Fundus: firm I  Basename 01/07/11 0535 01/06/11 0230  HGB 11.4* 12.5  HCT 34.0* 36.8    Assessment/Plan: D/C home at mother's request, d/c with Motrin, percocet and PNV, f/u 6 weeks   LOS: 1 day   BOVARD,[name] 01/07/2011, 9:13 AM

## 2011-03-13 ENCOUNTER — Other Ambulatory Visit: Payer: Self-pay | Admitting: Obstetrics and Gynecology

## 2011-03-14 ENCOUNTER — Encounter (HOSPITAL_COMMUNITY): Payer: Self-pay

## 2011-03-15 ENCOUNTER — Encounter (HOSPITAL_COMMUNITY): Payer: Self-pay | Admitting: *Deleted

## 2011-03-18 ENCOUNTER — Other Ambulatory Visit: Payer: Self-pay | Admitting: Obstetrics and Gynecology

## 2011-03-23 ENCOUNTER — Encounter (HOSPITAL_COMMUNITY): Admission: RE | Payer: Self-pay | Source: Ambulatory Visit

## 2011-03-23 ENCOUNTER — Ambulatory Visit (HOSPITAL_COMMUNITY)
Admission: RE | Admit: 2011-03-23 | Payer: Medicaid Other | Source: Ambulatory Visit | Admitting: Obstetrics and Gynecology

## 2011-03-23 HISTORY — DX: Unspecified osteoarthritis, unspecified site: M19.90

## 2011-03-23 SURGERY — CONE BIOPSY, CERVIX
Anesthesia: General

## 2011-03-28 NOTE — H&P (Signed)
**Note [name]** [name], [name]             ACCOUNT NO.:  [id number]  MEDICAL RECORD NO.:  [id number]  LOCATION:                                 FACILITY:  PHYSICIAN:  Malachi Pro. Ambrose Mantle, M.D. 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  This patient has a history of abnormal Pap smears.  In November 2009, she had a cervical biopsy showing low-grade SIL with a focal area suspicious for high-grade SIL CIN II.  She was not seen again until 2012, when she had an abnormal Pap smear consistent with high-grade SIL.  At that time, she was advised to have a colposcopy, but the patient stated that she did not want to have the colposcopy done and she knew she had a right to refuse, and she was informed that she should not wait until after delivery to have the procedure done.  She replied that she had had Pap smears for many years and she would think about it.  She never had a colposcopy done, and at her 6 weeks checkup, her Pap smear showed high-grade SIL.  She had a colposcopy done on March 02, 2011.  There was wide epithelium at 1, 2, 11, and 12 o'clock.  Biopsies were done and an endocervical curettage was done.  An IUD was present, but after the biopsy results returned, I have advised the patient to come in and have her IUD removed, and the IUD was removed.  She was also recently complained that her weight fails to go down.  She underwent a prolactin, which was elevated.  Cortisol was normal and TSH was normal.  On February 27, we removed the IUD.  The patient is now scheduled for surgery.  She does claim to have had head congestion 2-3 days ago, and I called the  anesthesiologist at the hospital.  He advises proceeding with the surgery as long as she does not have a fever, so the patient is advised to take her temperature regularly between and the time of her surgery.  PAST MEDICAL HISTORY:  Reveals no known food or drug allergies.  She has had a history of depression, genital warts, herpes, migraines, and PIH with her first pregnancy.  She has had no surgical procedures.  She does not take any prescription drugs.  FAMILY HISTORY:  Her father has depression, anxiety, diabetes.  Her brother has diabetes.  Her mother has thyroid dysfunction.  OBSTETRIC HISTORY:  The patient has delivered 5 children between 1995 and 2012, and in January 2011, she had a termination of pregnancy.  She denies alcohol, tobacco, and illicit substance abuse.  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  PHYSICAL EXAMINATION:  VITAL SIGNS:  Blood pressure is 118/78, pulse is 80. HEAD, EYES, NOSE, AND THROAT:  Normal.  Thyroid is normal. LUNGS:  Clear to auscultation. HEART:  Normal size and sounds.  No murmurs. ABDOMEN:  Soft and nontender.  No masses are palpable.  On February 27, the vulva and vagina were clean.  The cervix was clean.  IUD strings were present.  The IUD was removed.  The uterus was anterior, normal size, sounded 9 cm.  The adnexa are clear.  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  The patient is admitted for conization.  She has been informed of all the risks of surgery including, but not limited to hemorrhage, infection, reoperation, injury to adjacent organs, perforation of the uterus.  She understands and agrees to proceed, although she is somewhat ambivalent about the surgery, she feels like may be a holistic medicine would do the job and she even states that sometimes if treatment is begun, but it makes the cancer advanced more quickly.  I tried to do spell her notion of  these events.  She has a relative who had melanoma of the vagina, and after treatment began, she died in 4 months.  I told her that this had nothing to do with the treatment, but to the disease itself.  She continues to ask questions about getting away without treatment.  At this point, I have told her that I would have to resign from her care if she elects not to proceed with the treatment.     Malachi Pro. Ambrose Mantle, M.D.     TFH/MEDQ  D:  03/28/2011  T:  03/28/2011  Job:  [id number]

## 2011-03-30 ENCOUNTER — Encounter (HOSPITAL_COMMUNITY): Payer: Self-pay | Admitting: Anesthesiology

## 2011-03-30 ENCOUNTER — Ambulatory Visit (HOSPITAL_COMMUNITY)
Admission: RE | Admit: 2011-03-30 | Discharge: 2011-03-30 | Disposition: A | Discharge status: Home / Self Care | Payer: Medicaid Other | Source: Ambulatory Visit | Attending: Obstetrics and Gynecology | Admitting: Obstetrics and Gynecology

## 2011-03-30 ENCOUNTER — Encounter (HOSPITAL_COMMUNITY): Payer: Self-pay | Admitting: *Deleted

## 2011-03-30 ENCOUNTER — Encounter (HOSPITAL_COMMUNITY)
Admission: RE | Disposition: A | Discharge status: Home / Self Care | Payer: Self-pay | Source: Ambulatory Visit | Attending: Obstetrics and Gynecology

## 2011-03-30 ENCOUNTER — Ambulatory Visit (HOSPITAL_COMMUNITY): Payer: Medicaid Other | Admitting: Anesthesiology

## 2011-03-30 DIAGNOSIS — D069 Carcinoma in situ of cervix, unspecified: Secondary | ICD-10-CM | POA: Insufficient documentation

## 2011-03-30 HISTORY — PX: CERVICAL CONIZATION W/BX: SHX1330

## 2011-03-30 HISTORY — PX: DILATION AND CURETTAGE OF UTERUS: SHX78

## 2011-03-30 LAB — COMPREHENSIVE METABOLIC PANEL
ALT: 23 U/L (ref 0–35)
AST: 16 U/L (ref 0–37)
Albumin: 3.5 g/dL (ref 3.5–5.2)
Alkaline Phosphatase: 72 U/L (ref 39–117)
Calcium: 8.9 mg/dL (ref 8.4–10.5)
GFR calc Af Amer: >90 mL/min (ref >90)
Glucose, Bld: 89 mg/dL (ref 70–99)
Potassium: 4 mEq/L (ref 3.5–5.1)
Sodium: 139 mEq/L (ref 135–145)
Total Protein: 6.5 g/dL (ref 6.0–8.3)

## 2011-03-30 LAB — DIFFERENTIAL
Eosinophils Absolute: 0.1 10*3/uL (ref 0.0–0.7)
Eosinophils Relative: 2 % (ref 0–5)
Lymphocytes Relative: 54 % — ABNORMAL HIGH (ref 12–46)
Lymphs Abs: 3.9 10*3/uL (ref 0.7–4.0)
Monocytes Relative: 8 % (ref 3–12)

## 2011-03-30 LAB — CBC
Hemoglobin: 12.4 g/dL (ref 12.0–15.0)
MCH: 30.2 pg (ref 26.0–34.0)
MCV: 91 fL (ref 78.0–100.0)
Platelets: 198 10*3/uL (ref 150–400)
RBC: 4.11 MIL/uL (ref 3.87–5.11)
WBC: 7.2 10*3/uL (ref 4.0–10.5)

## 2011-03-30 LAB — URINALYSIS, ROUTINE W REFLEX MICROSCOPIC
Bilirubin Urine: NEGATIVE
Glucose, UA: NEGATIVE mg/dL
Hgb urine dipstick: NEGATIVE
Specific Gravity, Urine: 1.02 (ref 1.005–1.030)
Urobilinogen, UA: 0.2 mg/dL (ref 0.0–1.0)
pH: 6.5 (ref 5.0–8.0)

## 2011-03-30 SURGERY — DILATION AND CURETTAGE
Anesthesia: General | Site: Uterus | Wound class: Clean Contaminated

## 2011-03-30 MED ORDER — MEPERIDINE HCL 25 MG/ML IJ SOLN: 6.2500 mg | INTRAMUSCULAR | Status: DC | PRN

## 2011-03-30 MED ORDER — KETOROLAC TROMETHAMINE 30 MG/ML IJ SOLN: 15.0000 mg | Freq: Once | INTRAMUSCULAR | Status: DC | PRN

## 2011-03-30 MED ORDER — VASOPRESSIN 20 UNIT/ML IJ SOLN
INTRAVENOUS | Status: DC | PRN
  Administered 2011-03-30: 10:00:00

## 2011-03-30 MED ORDER — DEXAMETHASONE SODIUM PHOSPHATE 10 MG/ML IJ SOLN
INTRAMUSCULAR | Status: AC
  Filled 2011-03-30: qty 1

## 2011-03-30 MED ORDER — GLYCOPYRROLATE 0.2 MG/ML IJ SOLN
INTRAMUSCULAR | Status: DC | PRN
  Administered 2011-03-30: 0.2 mg via INTRAVENOUS

## 2011-03-30 MED ORDER — LIDOCAINE HCL (CARDIAC) 20 MG/ML IV SOLN
INTRAVENOUS | Status: AC
  Filled 2011-03-30: qty 5

## 2011-03-30 MED ORDER — KETOROLAC TROMETHAMINE 30 MG/ML IJ SOLN
INTRAMUSCULAR | Status: DC | PRN
  Administered 2011-03-30: 30 mg via INTRAVENOUS

## 2011-03-30 MED ORDER — FENTANYL CITRATE 0.05 MG/ML IJ SOLN: 25.0000 ug | INTRAMUSCULAR | Status: DC | PRN

## 2011-03-30 MED ORDER — MIDAZOLAM HCL 2 MG/2ML IJ SOLN
INTRAMUSCULAR | Status: AC
  Filled 2011-03-30: qty 2

## 2011-03-30 MED ORDER — MIDAZOLAM HCL 5 MG/5ML IJ SOLN
INTRAMUSCULAR | Status: DC | PRN
  Administered 2011-03-30: 2 mg via INTRAVENOUS

## 2011-03-30 MED ORDER — GLYCOPYRROLATE 0.2 MG/ML IJ SOLN
INTRAMUSCULAR | Status: AC
  Filled 2011-03-30: qty 1

## 2011-03-30 MED ORDER — PROPOFOL 10 MG/ML IV EMUL
INTRAVENOUS | Status: DC | PRN
  Administered 2011-03-30: 200 mg via INTRAVENOUS

## 2011-03-30 MED ORDER — LACTATED RINGERS IV SOLN
INTRAVENOUS | Status: DC
  Administered 2011-03-30 (×2): via INTRAVENOUS

## 2011-03-30 MED ORDER — PROPOFOL 10 MG/ML IV EMUL
INTRAVENOUS | Status: AC
  Filled 2011-03-30: qty 20

## 2011-03-30 MED ORDER — DEXAMETHASONE SODIUM PHOSPHATE 4 MG/ML IJ SOLN
INTRAMUSCULAR | Status: DC | PRN
  Administered 2011-03-30: 10 mg via INTRAVENOUS

## 2011-03-30 MED ORDER — ONDANSETRON HCL 4 MG/2ML IJ SOLN
INTRAMUSCULAR | Status: DC | PRN
  Administered 2011-03-30: 4 mg via INTRAVENOUS

## 2011-03-30 MED ORDER — LIDOCAINE HCL (CARDIAC) 20 MG/ML IV SOLN
INTRAVENOUS | Status: DC | PRN
  Administered 2011-03-30: 50 mg via INTRAVENOUS

## 2011-03-30 MED ORDER — PROMETHAZINE HCL 25 MG/ML IJ SOLN: 6.2500 mg | INTRAMUSCULAR | Status: DC | PRN

## 2011-03-30 MED ORDER — FENTANYL CITRATE 0.05 MG/ML IJ SOLN
INTRAMUSCULAR | Status: AC
  Filled 2011-03-30: qty 5

## 2011-03-30 MED ORDER — FENTANYL CITRATE 0.05 MG/ML IJ SOLN
INTRAMUSCULAR | Status: DC | PRN
  Administered 2011-03-30: 50 ug via INTRAVENOUS
  Administered 2011-03-30: 100 ug via INTRAVENOUS

## 2011-03-30 MED ORDER — VASOPRESSIN 20 UNIT/ML IJ SOLN
INTRAMUSCULAR | Status: AC
  Filled 2011-03-30: qty 1

## 2011-03-30 MED ORDER — ONDANSETRON HCL 4 MG/2ML IJ SOLN
INTRAMUSCULAR | Status: AC
  Filled 2011-03-30: qty 2

## 2011-03-30 SURGICAL SUPPLY — 21 items
CATH FOLEY 2WAY  5CC 16FR SIL (CATHETERS) ×1
CATH FOLEY 2WAY 5CC 16FR SIL (CATHETERS) IMPLANT
CATH ROBINSON RED A/P 16FR (CATHETERS) ×2 IMPLANT
CLOTH BEACON ORANGE TIMEOUT ST (SAFETY) ×3 IMPLANT
CONTAINER PREFILL 10% NBF 60ML (FORM) ×7 IMPLANT
EVACUATOR PREFILTER SMOKE (MISCELLANEOUS) ×1 IMPLANT
GLOVE BIO SURGEON STRL SZ7.5 (GLOVE) ×6 IMPLANT
GOWN PREVENTION PLUS LG XLONG (DISPOSABLE) ×3 IMPLANT
GOWN PREVENTION PLUS XLARGE (GOWN DISPOSABLE) ×3 IMPLANT
NDL SPNL 22GX3.5 QUINCKE BK (NEEDLE) ×2 IMPLANT
NEEDLE SPNL 22GX3.5 QUINCKE BK (NEEDLE) ×3 IMPLANT
PACK VAGINAL MINOR WOMEN LF (CUSTOM PROCEDURE TRAY) ×3 IMPLANT
PAD PREP 24X48 CUFFED NSTRL (MISCELLANEOUS) ×3 IMPLANT
REDUCER FITTING SMOKE EVAC (MISCELLANEOUS) ×1 IMPLANT
SUT CHROMIC 0 CT 1 (SUTURE) ×2 IMPLANT
SYR CONTROL 10ML LL (SYRINGE) ×3 IMPLANT
TOWEL OR 17X24 6PK STRL BLUE (TOWEL DISPOSABLE) ×6 IMPLANT
TUBING CONNECTOR 18X5MM (MISCELLANEOUS) ×1 IMPLANT
TUBING SMOKE EVAC HOSE ADAPTER (MISCELLANEOUS) ×1 IMPLANT
WATER STERILE IRR 1000ML POUR (IV SOLUTION) ×3 IMPLANT
YANKAUER SUCT BULB TIP NO VENT (SUCTIONS) ×1 IMPLANT

## 2011-03-30 NOTE — Anesthesia Postprocedure Evaluation (Signed)
Anesthesia Post Note  Patient: [name]  Procedure(s) Performed: Procedure(s) (LRB): DILATATION AND CURETTAGE (N/A) CONIZATION CERVIX WITH BIOPSY (N/A)  Anesthesia type: GA  Patient location: PACU  Post pain: Pain level controlled  Post assessment: Post-op Vital signs reviewed  Last Vitals:  Filed Vitals:   03/30/11 1100  BP: 105/67  Pulse: 60  Temp:   Resp: 17    Post vital signs: Reviewed  Level of consciousness: sedated  Complications: No apparent anesthesia complications

## 2011-03-30 NOTE — Anesthesia Preprocedure Evaluation (Signed)
Anesthesia Evaluation  Patient identified by MRN, date of birth, ID band Patient awake    Reviewed: Allergy & Precautions, H&P , NPO status , Patient's Chart, lab work & pertinent test results  Airway Mallampati: I TM Distance: >3 FB Neck ROM: full    Dental No notable dental hx. (+) Teeth Intact   Pulmonary neg pulmonary ROS,    Pulmonary exam normal       Cardiovascular negative cardio ROS      Neuro/Psych  Headaches, PSYCHIATRIC DISORDERS Depression    GI/Hepatic negative GI ROS, Neg liver ROS,   Endo/Other  negative endocrine ROS  Renal/GU negative Renal ROS  negative genitourinary   Musculoskeletal negative musculoskeletal ROS (+)   Abdominal Normal abdominal exam  (+)   Peds negative pediatric ROS (+)  Hematology negative hematology ROS (+)   Anesthesia Other Findings   Reproductive/Obstetrics negative OB ROS                           Anesthesia Physical Anesthesia Plan  ASA: II  Anesthesia Plan: General   Post-op Pain Management:    Induction: Intravenous  Airway Management Planned:   Additional Equipment:   Intra-op Plan:   Post-operative Plan:   Informed Consent: I have reviewed the patients History and Physical, chart, labs and discussed the procedure including the risks, benefits and alternatives for the proposed anesthesia with the patient or authorized representative who has indicated his/her understanding and acceptance.     Plan Discussed with: CRNA and Surgeon  Anesthesia Plan Comments:         Anesthesia Quick Evaluation

## 2011-03-30 NOTE — Op Note (Signed)
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  Operator: Ambrose Mantle  The patient was brought to the operating room and placed under satisfactory general anesthesia and placed in the [facility] stirrups in lithotomy position. Exam revealed the uterus to be posterior normal size and the adnexa were free of masses. A timeout was done. The vulva was prepped with Betadine solution, the urethra was prepped, and the bladder was emptied with a [place] catheter that was latex free. The area was draped with wet towels as a sterile field. Using an ebonized graves speculum the cervix was visualized and treated with 5% acetic acid. There were no significant abnormalities on the exocervix. We tried to use the CO2 laser but theHeNe beam was well off target and could not be made to function properly. I injected the cervix with 18 cc of a dilute solution of Pitressin using 10 units of Pitressin in  120 cc of saline.I prepped the cervix with betadine. I did a cold knife conization, obtained hemostasis with the Bovie, then did an endocervical and endometrial curettage. All tissue was preserved and sent to pathology. The cervix was reconstructed with 4 sutures of 0 chromic placing the sutures from 10:30 to 1:30 4:30 to 7:30 7:30 to 10:30 and 1:30 to 4:30. The cervix was sounded and found to be patent. The procedure was terminated. Blood loss approximately 20 cc. Sponge and needle counts were correct The cone was cut at 12 o'clock.

## 2011-03-30 NOTE — Transfer of Care (Signed)
Immediate Anesthesia Transfer of Care Note  Patient: [name]  Procedure(s) Performed: Procedure(s) (LRB): DILATATION AND CURETTAGE (N/A) CONIZATION CERVIX WITH BIOPSY (N/A)  Patient Location: PACU  Anesthesia Type: General  Level of Consciousness: awake and oriented  Airway & Oxygen Therapy: Patient Spontanous Breathing and Patient connected to nasal cannula oxygen  Post-op Assessment: Report given to PACU RN and Post -op Vital signs reviewed and stable  Post vital signs: stable  Complications: No apparent anesthesia complications

## 2011-03-30 NOTE — Progress Notes (Signed)
Patient ID: [name], female   DOB: 08-20-1975, 36 y.o.   MRN: [id number] I examined this lady on 03-28-11 and she reports no change in her health since that time.

## 2011-03-30 NOTE — Discharge Instructions (Signed)
No vaginal entrance, call with temp> 100.4 degrees, call with any heavy bleeding or other problems. DISCHARGE INSTRUCTIONS: D&C / D&E The following instructions have been prepared to help you care for yourself upon your return home.   Personal hygiene: Marland Kitchen Use sanitary pads for vaginal drainage, not tampons. . Shower the day after your procedure. . NO tub baths, pools or Jacuzzis for 2-3 weeks. . Wipe front to back after using the bathroom.  Activity and limitations: . Do NOT drive or operate any equipment for 24 hours. The effects of anesthesia are still present and drowsiness may result. . Do NOT rest in bed all day. . Walking is encouraged. . Walk up and down stairs slowly. . You may resume your normal activity in one to two days or as indicated by your physician.  Sexual activity: NO intercourse for at least 2 weeks after the procedure, or as indicated by your physician.  Diet: Eat a light meal as desired this evening. You may resume your usual diet tomorrow.  Return to work: You may resume your work activities in one to two days or as indicated by your doctor.  What to expect after your surgery: Expect to have vaginal bleeding/discharge for 2-3 days and spotting for up to 10 days. It is not unusual to have soreness for up to 1-2 weeks. You may have a slight burning sensation when you urinate for the first day. Mild cramps may continue for a couple of days. You may have a regular period in 2-6 weeks.  Call your doctor for any of the following: . Excessive vaginal bleeding, saturating and changing one pad every hour. . Inability to urinate 6 hours after discharge from hospital. . Pain not relieved by pain medication. . Fever of 100.4 F or greater. . Unusual vaginal discharge or odor.  Return to office ________________ Call for an appointment ___________________  Patient's signature: ______________________  Nurse's signature ________________________  Post Anesthesia Care Unit  [phone]

## 2011-03-31 ENCOUNTER — Encounter (HOSPITAL_COMMUNITY): Payer: Self-pay | Admitting: Obstetrics and Gynecology

## 2011-05-25 ENCOUNTER — Encounter (HOSPITAL_COMMUNITY): Payer: Self-pay | Admitting: *Deleted

## 2011-06-06 ENCOUNTER — Other Ambulatory Visit: Payer: Self-pay | Admitting: Obstetrics and Gynecology

## 2011-06-06 NOTE — H&P (Signed)
[name], [name]             ACCOUNT NO.:  [id number]  MEDICAL RECORD NO.:  [id number]  LOCATION:                                 FACILITY:  PHYSICIAN:  Malachi Pro. Ambrose Mantle, M.D. 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  The patient had the procedure on March 28, 2011, and has had some ambivalence about how to proceed.  I did call Dr. Nelly Rout with [facility] and she stated that the patient needed another conization, and then if there was no invasion found, she should need a simple hysterectomy, if invasive adenocarcinoma was found, she would need a simple hysterectomy.  I did talk with Dr. Colonel Bald, the pathologist who said that grading of the adenocarcinoma of the endocervix could not be established because there were just strips of tissue and no tissue to state whether there was invasive cancer.  PAST MEDICAL HISTORY:  No known allergies.  History of depression, genital warts, herpes, and migraines.  PAST SURGICAL HISTORY:  She has had no surgeries other than D and C and conization.  FAMILY HISTORY:  Father has depression, anxiety, diabetes, brother has diabetes, mother has thyroid dysfunction.  OBSTETRIC HISTORY:  She has had 5 children between 1995 and 2012, and in January 2011, she had a termination of pregnancy.  She denies alcohol, tobacco, and illicit substance abuse.  PHYSICAL EXAMINATION:  On admission: VITAL SIGNS:  Normal. HEART:  Normal. LUNGS:  Normal. ABDOMEN:  Soft and nontender.  No masses are palpable. GU:  Vulva and vagina are clean.  The cervix is clean.   Uterus is posterior normal size.  The adnexa are free of masses.  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  The patient is admitted for repeat dilation and curettage and conization per Dr. Nelly Rout at the [facility], and if the tissue shows no invasive cancer, she should be followed with a simple hysterectomy, and if it shows invasive cancer, then she should have a radical hysterectomy.  The patient has been informed of this and is ready to proceed.  She has been informed of the complications that are possible and she understands.     Malachi Pro. Ambrose Mantle, M.D.     TFH/MEDQ  D:  06/06/2011  T:  06/06/2011  Job:  [id number]

## 2011-06-08 ENCOUNTER — Encounter (HOSPITAL_COMMUNITY): Payer: Self-pay | Admitting: Anesthesiology

## 2011-06-08 ENCOUNTER — Ambulatory Visit (HOSPITAL_COMMUNITY): Payer: Medicaid Other | Admitting: Anesthesiology

## 2011-06-08 ENCOUNTER — Encounter (HOSPITAL_COMMUNITY)
Admission: RE | Disposition: A | Discharge status: Home / Self Care | Payer: Self-pay | Source: Ambulatory Visit | Attending: Obstetrics and Gynecology

## 2011-06-08 ENCOUNTER — Encounter (HOSPITAL_COMMUNITY): Payer: Self-pay | Admitting: *Deleted

## 2011-06-08 ENCOUNTER — Ambulatory Visit (HOSPITAL_COMMUNITY)
Admission: RE | Admit: 2011-06-08 | Discharge: 2011-06-08 | Disposition: A | Discharge status: Home / Self Care | Payer: Medicaid Other | Source: Ambulatory Visit | Attending: Obstetrics and Gynecology | Admitting: Obstetrics and Gynecology

## 2011-06-08 DIAGNOSIS — D069 Carcinoma in situ of cervix, unspecified: Secondary | ICD-10-CM

## 2011-06-08 HISTORY — PX: CERVICAL CONIZATION W/BX: SHX1330

## 2011-06-08 HISTORY — PX: DILATION AND CURETTAGE OF UTERUS: SHX78

## 2011-06-08 LAB — CBC
HCT: 37.3 % (ref 36.0–46.0)
Hemoglobin: 12.5 g/dL (ref 12.0–15.0)
MCHC: 33.5 g/dL (ref 30.0–36.0)
RBC: 4.14 MIL/uL (ref 3.87–5.11)
WBC: 6.8 10*3/uL (ref 4.0–10.5)

## 2011-06-08 LAB — URINALYSIS, ROUTINE W REFLEX MICROSCOPIC
Glucose, UA: NEGATIVE mg/dL
Ketones, ur: NEGATIVE mg/dL
Leukocytes, UA: NEGATIVE
Specific Gravity, Urine: 1.025 (ref 1.005–1.030)
pH: 6 (ref 5.0–8.0)

## 2011-06-08 LAB — DIFFERENTIAL
Lymphocytes Relative: 49 % — ABNORMAL HIGH (ref 12–46)
Lymphs Abs: 3.3 10*3/uL (ref 0.7–4.0)
Monocytes Absolute: 0.5 10*3/uL (ref 0.1–1.0)
Monocytes Relative: 7 % (ref 3–12)
Neutro Abs: 2.9 10*3/uL (ref 1.7–7.7)

## 2011-06-08 SURGERY — CONE BIOPSY, CERVIX
Anesthesia: General | Site: Vagina | Wound class: Clean Contaminated

## 2011-06-08 MED ORDER — MIDAZOLAM HCL 5 MG/5ML IJ SOLN
INTRAMUSCULAR | Status: DC | PRN
  Administered 2011-06-08: 2 mg via INTRAVENOUS

## 2011-06-08 MED ORDER — PROPOFOL 10 MG/ML IV EMUL
INTRAVENOUS | Status: AC
  Filled 2011-06-08: qty 20

## 2011-06-08 MED ORDER — KETOROLAC TROMETHAMINE 30 MG/ML IJ SOLN
INTRAMUSCULAR | Status: AC
  Filled 2011-06-08: qty 1

## 2011-06-08 MED ORDER — KETOROLAC TROMETHAMINE 30 MG/ML IJ SOLN
INTRAMUSCULAR | Status: DC | PRN
  Administered 2011-06-08: 30 mg via INTRAVENOUS

## 2011-06-08 MED ORDER — FENTANYL CITRATE 0.05 MG/ML IJ SOLN
INTRAMUSCULAR | Status: DC | PRN
  Administered 2011-06-08 (×4): 50 ug via INTRAVENOUS

## 2011-06-08 MED ORDER — ONDANSETRON HCL 4 MG/2ML IJ SOLN
INTRAMUSCULAR | Status: AC
  Filled 2011-06-08: qty 2

## 2011-06-08 MED ORDER — LIDOCAINE HCL (CARDIAC) 20 MG/ML IV SOLN
INTRAVENOUS | Status: DC | PRN
  Administered 2011-06-08: 30 mg via INTRAVENOUS

## 2011-06-08 MED ORDER — VASOPRESSIN 20 UNIT/ML IJ SOLN
INTRAMUSCULAR | Status: DC | PRN
  Administered 2011-06-08: 5 [IU]

## 2011-06-08 MED ORDER — HYDROCODONE-ACETAMINOPHEN 5-325 MG PO TABS
ORAL_TABLET | ORAL | Status: AC
  Filled 2011-06-08: qty 1

## 2011-06-08 MED ORDER — FENTANYL CITRATE 0.05 MG/ML IJ SOLN
INTRAMUSCULAR | Status: AC
  Administered 2011-06-08: 50 ug via INTRAVENOUS
  Filled 2011-06-08: qty 2

## 2011-06-08 MED ORDER — VASOPRESSIN 20 UNIT/ML IJ SOLN
INTRAMUSCULAR | Status: AC
  Filled 2011-06-08: qty 1

## 2011-06-08 MED ORDER — LIDOCAINE HCL (CARDIAC) 20 MG/ML IV SOLN
INTRAVENOUS | Status: AC
  Filled 2011-06-08: qty 5

## 2011-06-08 MED ORDER — GLYCOPYRROLATE 0.2 MG/ML IJ SOLN
INTRAMUSCULAR | Status: DC | PRN
  Administered 2011-06-08: 0.3 mg via INTRAVENOUS

## 2011-06-08 MED ORDER — KETOROLAC TROMETHAMINE 30 MG/ML IJ SOLN: 15.0000 mg | Freq: Once | INTRAMUSCULAR | Status: DC | PRN

## 2011-06-08 MED ORDER — ONDANSETRON HCL 4 MG/2ML IJ SOLN
INTRAMUSCULAR | Status: DC | PRN
  Administered 2011-06-08: 4 mg via INTRAVENOUS

## 2011-06-08 MED ORDER — MIDAZOLAM HCL 2 MG/2ML IJ SOLN
INTRAMUSCULAR | Status: AC
  Filled 2011-06-08: qty 2

## 2011-06-08 MED ORDER — MEPERIDINE HCL 25 MG/ML IJ SOLN: 6.2500 mg | INTRAMUSCULAR | Status: DC | PRN

## 2011-06-08 MED ORDER — ACETIC ACID 4% SOLUTION
Status: DC | PRN
  Administered 2011-06-08: 1 via TOPICAL

## 2011-06-08 MED ORDER — PROMETHAZINE HCL 25 MG/ML IJ SOLN: 6.2500 mg | INTRAMUSCULAR | Status: DC | PRN

## 2011-06-08 MED ORDER — FENTANYL CITRATE 0.05 MG/ML IJ SOLN
25.0000 ug | INTRAMUSCULAR | Status: DC | PRN
  Administered 2011-06-08 (×2): 50 ug via INTRAVENOUS

## 2011-06-08 MED ORDER — FENTANYL CITRATE 0.05 MG/ML IJ SOLN
INTRAMUSCULAR | Status: AC
  Filled 2011-06-08: qty 5

## 2011-06-08 MED ORDER — HYDROCODONE-ACETAMINOPHEN 5-325 MG PO TABS
1.0000 | ORAL_TABLET | Freq: Once | ORAL | Status: AC
  Administered 2011-06-08: 1 via ORAL

## 2011-06-08 MED ORDER — SODIUM CHLORIDE 0.9 % IJ SOLN
INTRAMUSCULAR | Status: DC | PRN
  Administered 2011-06-08: 15 mL via INTRAVENOUS

## 2011-06-08 MED ORDER — GLYCOPYRROLATE 0.2 MG/ML IJ SOLN
INTRAMUSCULAR | Status: AC
  Filled 2011-06-08: qty 1

## 2011-06-08 MED ORDER — IBUPROFEN 600 MG PO TABS: 600.0000 mg | ORAL_TABLET | Freq: Four times a day (QID) | ORAL | Status: AC | PRN

## 2011-06-08 MED ORDER — LACTATED RINGERS IV SOLN
INTRAVENOUS | Status: DC
  Administered 2011-06-08: 12:00:00 via INTRAVENOUS

## 2011-06-08 SURGICAL SUPPLY — 24 items
APPLICATOR COTTON TIP 6IN STRL (MISCELLANEOUS) ×1 IMPLANT
BLADE SURG 15 STRL LF C SS BP (BLADE) ×2 IMPLANT
BLADE SURG 15 STRL SS (BLADE) ×3
CATH ROBINSON RED A/P 16FR (CATHETERS) ×3 IMPLANT
CLOTH BEACON ORANGE TIMEOUT ST (SAFETY) ×3 IMPLANT
CONTAINER PREFILL 10% NBF 60ML (FORM) ×7 IMPLANT
COUNTER NEEDLE 1200 MAGNETIC (NEEDLE) ×3 IMPLANT
ELECT REM PT RETURN 9FT ADLT (ELECTROSURGICAL) ×3
ELECTRODE REM PT RTRN 9FT ADLT (ELECTROSURGICAL) IMPLANT
GAUZE SPONGE 4X4 16PLY XRAY LF (GAUZE/BANDAGES/DRESSINGS) ×1 IMPLANT
GLOVE BIO SURGEON STRL SZ7.5 (GLOVE) ×6 IMPLANT
GOWN PREVENTION PLUS LG XLONG (DISPOSABLE) ×3 IMPLANT
GOWN PREVENTION PLUS XLARGE (GOWN DISPOSABLE) ×3 IMPLANT
GOWN STRL REIN XL XLG (GOWN DISPOSABLE) ×3 IMPLANT
NDL SPNL 22GX3.5 QUINCKE BK (NEEDLE) ×2 IMPLANT
NEEDLE SPNL 22GX3.5 QUINCKE BK (NEEDLE) ×3 IMPLANT
NS IRRIG 1000ML POUR BTL (IV SOLUTION) ×3 IMPLANT
PACK VAGINAL MINOR WOMEN LF (CUSTOM PROCEDURE TRAY) ×3 IMPLANT
PAD PREP 24X48 CUFFED NSTRL (MISCELLANEOUS) ×3 IMPLANT
PENCIL BUTTON HOLSTER BLD 10FT (ELECTRODE) ×3 IMPLANT
SCOPETTES 8  STERILE (MISCELLANEOUS) ×2
SCOPETTES 8 STERILE (MISCELLANEOUS) ×4 IMPLANT
SUT CHROMIC 0 CT 1 (SUTURE) ×6 IMPLANT
SYR CONTROL 10ML LL (SYRINGE) ×3 IMPLANT

## 2011-06-08 NOTE — Anesthesia Preprocedure Evaluation (Signed)
Anesthesia Evaluation  Patient identified by MRN, date of birth, ID band Patient awake    Reviewed: Allergy & Precautions, H&P , NPO status , Patient's Chart, lab work & pertinent test results  Airway Mallampati: II TM Distance: >3 FB Neck ROM: full    Dental No notable dental hx. (+) Teeth Intact   Pulmonary neg pulmonary ROS,    Pulmonary exam normal       Cardiovascular negative cardio ROS      Neuro/Psych PSYCHIATRIC DISORDERS Depression    GI/Hepatic negative GI ROS, Neg liver ROS,   Endo/Other  negative endocrine ROS  Renal/GU negative Renal ROS  negative genitourinary   Musculoskeletal negative musculoskeletal ROS (+)   Abdominal Normal abdominal exam  (+)   Peds negative pediatric ROS (+)  Hematology negative hematology ROS (+)   Anesthesia Other Findings   Reproductive/Obstetrics negative OB ROS                           Anesthesia Physical Anesthesia Plan  ASA: II  Anesthesia Plan: General   Post-op Pain Management:    Induction: Intravenous  Airway Management Planned: LMA  Additional Equipment:   Intra-op Plan:   Post-operative Plan:   Informed Consent: I have reviewed the patients History and Physical, chart, labs and discussed the procedure including the risks, benefits and alternatives for the proposed anesthesia with the patient or authorized representative who has indicated his/her understanding and acceptance.     Plan Discussed with: CRNA and Surgeon  Anesthesia Plan Comments:         Anesthesia Quick Evaluation

## 2011-06-08 NOTE — Transfer of Care (Signed)
Immediate Anesthesia Transfer of Care Note  Patient: [name]  Procedure(s) Performed: Procedure(s) (LRB): CONIZATION CERVIX WITH BIOPSY (N/A) DILATATION AND CURETTAGE (N/A)  Patient Location: PACU  Anesthesia Type: General  Level of Consciousness: alert , oriented and patient cooperative  Airway & Oxygen Therapy: Patient Spontanous Breathing and Patient connected to nasal cannula oxygen  Post-op Assessment: Post -op Vital signs reviewed and stable and Patient moving all extremities  Post vital signs: Reviewed and stable  Complications: No apparent anesthesia complications

## 2011-06-08 NOTE — Op Note (Signed)
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  Postoperative diagnosis: Same  Operation: D&C conization of the cervix  Operator: Cleola Perryman  Anesthesia: Gen. Dr. Arby Barrette  The patient was brought to the operating room placed under satisfactory general anesthesia and placed in lithotomy position in the [facility] stirrups. A time out was done. The vulva vagina perineum and urethra were prepped with Betadine solution and the bladder was emptied with a [place] catheter. Exam revealed the uterus to the posterior normal-sized, there was significant cystocele and rectocele, and there were no adnexal abnormalities. The area was draped as a sterile field. The anterior lip of the cervix was grasped and 15 cc of dilute solution of pitressin was injected at the cervicovaginal junction. The cervix was somewhat shortened because she had had a recent conization. A conization was done in the usual fashion however I attempted to stay closer to the cervical os and deeper than usual to try to exclude a diagnosis of invasive adenocarcinoma. Hemostasis was obtained with the Bovie. An endocervical curettage was done, followed by an endometrial curettage, and then the cervix was reconstructed with 4 sutures of 0 chromic. The sutures were placed from 10:30 to 1:30, 4:30 to 7:30, 1:30 to 4:30, and 7:30 to 10:30. Hemostasis was complete. The cervix was sounded to ensure patency of the canal and the procedure was terminated. The cervical cone was cut at 12:00. Sponge and needle counts were correct.

## 2011-06-08 NOTE — Anesthesia Postprocedure Evaluation (Signed)
Anesthesia Post Note  Patient: [name]  Procedure(s) Performed: Procedure(s) (LRB): CONIZATION CERVIX WITH BIOPSY (N/A) DILATATION AND CURETTAGE (N/A)  Anesthesia type: General  Patient location: PACU  Post pain: Pain level controlled  Post assessment: Post-op Vital signs reviewed  Last Vitals:  Filed Vitals:   06/08/11 1155  BP: 103/67  Pulse: 66  Temp: 36.7 C  Resp: 16    Post vital signs: Reviewed  Level of consciousness: sedated  Complications: No apparent anesthesia complicationsfj

## 2011-06-08 NOTE — Progress Notes (Signed)
Patient ID: [name], female   DOB: 1975-03-09, 36 y.o.   MRN: [id number] Pt was examined 06-06-11 and she reports no change in her health since that time.

## 2011-06-08 NOTE — Discharge Instructions (Signed)
No vaginal entrance. Call with temp > 100.4 degrees. Call with heavy bleeding or any other problemsConization of the Cervix Conization is the cutting (excision) of a cone-shaped portion of the cervix. This procedure is usually done when there is abnormal bleeding from the cervix. It can also be done to evaluate an abnormal Pap smear or if an abnormality is seen on the cervix during an exam. Conization of the cervix is not done during a menstrual period or pregnancy. BEFORE THE PROCEDURE  Do not eat or drink anything for 6 to 8 hours before the procedure, especially if you are going to be given a drug to make you sleep (general anesthetic).   Do not take aspirin or blood thinners for at least a week before the procedure, or as directed.   Arrive at least an hour before the procedure to read and sign the necessary forms.   Arrange for someone to take you home after the procedure.   If you smoke, do not smoke for 2 weeks before the procedure.  LET YOUR CAREGIVER KNOW THE FOLLOWING:  Allergies to food or medications.   All the medications you are taking including over-the-counter and prescription medications, herbs, eyedrops, and creams.   You develop a cold or an infection.   If you are using illegal drugs or drinking too much alcohol.   Your smoking habits.   Previous problems with anesthetics including novocaine.   The possibility of being pregnant.   History of blood clots or other bleeding problems.   Other medical problems.  RISKS AND COMPLICATIONS   Bleeding.   Infection.   Damage to the cervix.   Injury to surrounding organs.   Problems with the anesthesia.  PROCEDURE Conization of the cervix can be done by:  Cold knife. This type cuts out the cervical canal and the transformation zone (where the normal cells end and the abnormal cells begin) with a scalpel.   LEEP (electrocautery). This type is done with a thin wire that can cut and burn (cauterize) the cervical  tissue with an electrical current.   Laser treatment. This type cuts and burns (cauterizes) the tissue of the cervix to prevent bleeding with a laser beam.  You will be given a drug to make you sleep (general anesthetic) for cold knife and laser treatments, and you will be given a numbing medication (local anesthetic) for the LEEP procedure. Conization is usually done using colposcopy. Colposcopy magnifies the cervix to see it more clearly. The tissue removed is examined under a microscope by a doctor Psychologist, educational). The pathologist will provide a report to your caregiver. This will help your caregiver decide if further treatment is necessary. This report will also help your caregiver decide on the best treatment if your results are abnormal.  AFTER THE PROCEDURE  If you had a general anesthetic, you may be groggy for a couple of hours after the procedure.   If you had a local anesthetic, you will be advised to rest at the surgical center or caregiver's office until you are stable and feel ready to go home.   Have someone take you home.   You may have some cramping for a couple of days.   You may have a bloody discharge or light bleeding for 2 to 4 weeks.   You may have a black discharge coming from the vagina. This is from the paste used on the cervix to prevent bleeding. This is normal discharge.  HOME CARE INSTRUCTIONS   Do not  drive for 24 hours.   Avoid strenuous activities and exercises for at least 7 - 10 days.   Only take over-the-counter or prescription medicines for pain, discomfort, or fever as directed by your caregiver.   Do not take aspirin. It can cause or aggravate bleeding.   You may resume your usual diet.   Drink 6 to 8 glasses of fluid a day.   Rest and sleep the first 24 to 48 hours.   Take showers instead of baths until your caregiver gives you the okay.   Do not use tampons, douche or have intercourse for 4 weeks, or as advised by your caregiver.   Do not  lift anything over 10 pounds (4.5 kg) for at least 7 to 10 days, or as advised by your caregiver.   Take your temperature twice a day, for 4 to 5 days. Write them down.   Do not drink or drive when taking pain medication.   If you develop constipation:   Take a mild laxative with the advice of your caregiver.   Eat bran foods.   Drink a lot of fluids.   Try to have someone with you or available for you the first 24 to 48 hours, especially if you had a general anesthetic.   Make sure you and your family understand everything about your surgery and recovery.   Follow your caregiver's advice regarding follow-up appointments and Pap smears.  SEEK MEDICAL CARE IF:   You develop a rash.   You are dizzy or light-headed.   You feel sick to your stomach (nauseous).   You develop a bad smelling vaginal discharge.   You have an abnormal reaction or allergy to your medication.   You need stronger pain medication.  SEEK IMMEDIATE MEDICAL CARE IF:   You have blood clots or bleeding that is heavier than a normal menstrual period, or you develop bright red bleeding.   An oral temperature above 102 F (38.9 C) develops and persists for several hours.   You have increasing cramps.   You pass out.   You have painful or bloody urine.   You start throwing up (vomiting).   The pain is not relieved with your pain medication.  Not all test results are available during your visit. If your test results are not back during your the visit, make an appointment with your caregiver to find out the results. Do not assume everything is normal if you have not heard from your caregiver or the medical facility. It is important for you to follow up on all of your test results. Document Released: 10/13/2004 Document Revised: 12/23/2010 Document Reviewed: 08/04/2008 [facility] Patient Information 2012 [place], [place].

## 2011-06-09 ENCOUNTER — Encounter (HOSPITAL_COMMUNITY): Payer: Self-pay | Admitting: Obstetrics and Gynecology

## 2012-03-05 ENCOUNTER — Ambulatory Visit (INDEPENDENT_AMBULATORY_CARE_PROVIDER_SITE_OTHER): Payer: Self-pay | Admitting: Obstetrics & Gynecology

## 2012-03-05 ENCOUNTER — Encounter: Payer: Self-pay | Admitting: Obstetrics & Gynecology

## 2012-03-05 VITALS — BP 126/75 | HR 73 | Temp 97.1°F | Ht 65.0 in | Wt 170.9 lb

## 2012-03-05 DIAGNOSIS — R102 Pelvic and perineal pain unspecified side: Secondary | ICD-10-CM

## 2012-03-05 DIAGNOSIS — N949 Unspecified condition associated with female genital organs and menstrual cycle: Secondary | ICD-10-CM

## 2012-03-05 DIAGNOSIS — D069 Carcinoma in situ of cervix, unspecified: Secondary | ICD-10-CM

## 2012-03-05 NOTE — Patient Instructions (Signed)
Conization of the Cervix Conization is the cutting (excision) of a cone-shaped portion of the cervix. This procedure is usually done when there is abnormal bleeding from the cervix. It can also be done to evaluate an abnormal Pap smear or if an abnormality is seen on the cervix during an exam. Conization of the cervix is not done during a menstrual period or pregnancy. BEFORE THE PROCEDURE  Do not eat or drink anything for 6 to 8 hours before the procedure, especially if you are going to be given a drug to make you sleep (general anesthetic).  Do not take aspirin or blood thinners for at least a week before the procedure, or as directed.  Arrive at least an hour before the procedure to read and sign the necessary forms.  Arrange for someone to take you home after the procedure.  If you smoke, do not smoke for 2 weeks before the procedure. LET YOUR CAREGIVER KNOW THE FOLLOWING:  Allergies to food or medications.  All the medications you are taking including over-the-counter and prescription medications, herbs, eyedrops, and creams.  You develop a cold or an infection.  If you are using illegal drugs or drinking too much alcohol.  Your smoking habits.  Previous problems with anesthetics including novocaine.  The possibility of being pregnant.  History of blood clots or other bleeding problems.  Other medical problems. RISKS AND COMPLICATIONS   Bleeding.  Infection.  Damage to the cervix.  Injury to surrounding organs.  Problems with the anesthesia. PROCEDURE Conization of the cervix can be done by:  Cold knife. This type cuts out the cervical canal and the transformation zone (where the normal cells end and the abnormal cells begin) with a scalpel.  LEEP (electrocautery). This type is done with a thin wire that can cut and burn (cauterize) the cervical tissue with an electrical current.  Laser treatment. This type cuts and burns (cauterizes) the tissue of the cervix to  prevent bleeding with a laser beam. You will be given a drug to make you sleep (general anesthetic) for cold knife and laser treatments, and you will be given a numbing medication (local anesthetic) for the LEEP procedure. Conization is usually done using colposcopy. Colposcopy magnifies the cervix to see it more clearly. The tissue removed is examined under a microscope by a doctor (pathologist). The pathologist will provide a report to your caregiver. This will help your caregiver decide if further treatment is necessary. This report will also help your caregiver decide on the best treatment if your results are abnormal.  AFTER THE PROCEDURE  If you had a general anesthetic, you may be groggy for a couple of hours after the procedure.  If you had a local anesthetic, you will be advised to rest at the surgical center or caregiver's office until you are stable and feel ready to go home.  Have someone take you home.  You may have some cramping for a couple of days.  You may have a bloody discharge or light bleeding for 2 to 4 weeks.  You may have a black discharge coming from the vagina. This is from the paste used on the cervix to prevent bleeding. This is normal discharge. HOME CARE INSTRUCTIONS   Do not drive for 24 hours.  Avoid strenuous activities and exercises for at least 7 - 10 days.  Only take over-the-counter or prescription medicines for pain, discomfort, or fever as directed by your caregiver.  Do not take aspirin. It can cause or aggravate   bleeding.  You may resume your usual diet.  Drink 6 to 8 glasses of fluid a day.  Rest and sleep the first 24 to 48 hours.  Take showers instead of baths until your caregiver gives you the okay.  Do not use tampons, douche or have intercourse for 4 weeks, or as advised by your caregiver.  Do not lift anything over 10 pounds (4.5 kg) for at least 7 to 10 days, or as advised by your caregiver.  Take your temperature twice a day, for  4 to 5 days. Write them down.  Do not drink or drive when taking pain medication.  If you develop constipation:  Take a mild laxative with the advice of your caregiver.  Eat bran foods.  Drink a lot of fluids.  Try to have someone with you or available for you the first 24 to 48 hours, especially if you had a general anesthetic.  Make sure you and your family understand everything about your surgery and recovery.  Follow your caregiver's advice regarding follow-up appointments and Pap smears. SEEK MEDICAL CARE IF:   You develop a rash.  You are dizzy or light-headed.  You feel sick to your stomach (nauseous).  You develop a bad smelling vaginal discharge.  You have an abnormal reaction or allergy to your medication.  You need stronger pain medication. SEEK IMMEDIATE MEDICAL CARE IF:   You have blood clots or bleeding that is heavier than a normal menstrual period, or you develop bright red bleeding.  An oral temperature above 102 F (38.9 C) develops and persists for several hours.  You have increasing cramps.  You pass out.  You have painful or bloody urine.  You start throwing up (vomiting).  The pain is not relieved with your pain medication. Not all test results are available during your visit. If your test results are not back during your the visit, make an appointment with your caregiver to find out the results. Do not assume everything is normal if you have not heard from your caregiver or the medical facility. It is important for you to follow up on all of your test results. Document Released: 10/13/2004 Document Revised: 03/28/2011 Document Reviewed: 08/04/2008 [employer] Patient Information 2013 [employer].  

## 2012-03-05 NOTE — Progress Notes (Signed)
Patient ID: [name], female   DOB: Oct 24, 1975, 37 y.o.   MRN: [id number]  Chief Complaint  Patient presents with  . Vaginal Discharge    no periods but is having severe pain at belly button and brown discharge    HPI [name] is a 37 y.o. female.  Former patient of Dr. 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 Last pap 07/2011 was normal. Currently breast feeding 14 mo old but weaning. Beginning to have cyclic lower abdominal pain and spotting.  HPI  Past Medical History  Diagnosis Date  . Abnormal Pap smear   . Headache   . HSV infection   . 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 Depression     history - no meds    Past Surgical History  Procedure Laterality Date  . Svd      x 5  . Dilation and curettage of uterus  03/30/2011    Procedure: DILATATION AND CURETTAGE;  Surgeon: Bing Plume, MD;  Location: [facility];  Service: Gynecology;  Laterality: N/A;  . Cervical conization w/bx  03/30/2011    Procedure: CONIZATION CERVIX WITH BIOPSY;  Surgeon: Bing Plume, MD;  Location: [facility];  Service: Gynecology;  Laterality: N/A;  . Cervical conization w/bx  06/08/2011    Procedure: CONIZATION CERVIX WITH BIOPSY;  Surgeon: Bing Plume, MD;  Location: [facility];  Service: Gynecology;  Laterality: N/A;  conization/D & C  . Dilation and curettage of uterus  06/08/2011    Procedure: DILATATION AND CURETTAGE;  Surgeon: Bing Plume, MD;  Location: [facility];  Service: Gynecology;  Laterality: N/A;    Family History  Problem Relation Age of Onset  . Heart disease Mother   . Thyroid disease Mother   . Bipolar disorder Father   . Diabetes Brother     Social History History  Substance Use Topics  . Smoking status: Former Smoker -- 0.25 packs/day for 21 years    Types: Cigarettes    Quit date: 09/18/2010  . Smokeless tobacco: Never Used  . Alcohol Use: No    Allergies  Allergen Reactions  . Latex Itching and Other (See  Comments)    Reaction: yeast infection    Current Outpatient Prescriptions  Medication Sig Dispense Refill  . ibuprofen (ADVIL,MOTRIN) 200 MG tablet Take 200 mg by mouth every 6 (six) hours as needed for pain.      . prenatal vitamin w/FE, FA (PRENATAL 1 + 1) 27-1 MG TABS Take 1 tablet by mouth daily.        No current facility-administered medications for this visit.    Review of Systems Review of Systems  Constitutional: Negative for fever, appetite change and unexpected weight change (difficulty losing weight postpartum).  Gastrointestinal: Positive for abdominal pain. Negative for vomiting, constipation and abdominal distention.  Genitourinary: Positive for menstrual problem and pelvic pain. Negative for vaginal discharge, difficulty urinating and vaginal pain. Vaginal bleeding: brown spotting only.    Blood pressure 126/75, pulse 73, temperature 97.1 F (36.2 C), temperature source Oral, height 5\' 5"  (1.651 m), weight 170 lb 14.4 oz (77.52 kg), currently breastfeeding.  Physical Exam Physical Exam  Constitutional: She is oriented to person, place, and time. She appears well-developed and well-nourished. No distress.  Pulmonary/Chest: Effort normal. No respiratory distress.  Abdominal: She exhibits no distension and no mass. There is tenderness (mild lower abdomen). There is no rebound and  no guarding.  Genitourinary: Vagina normal. No vaginal discharge found.  Cervix scarred, anterior. Pap done. No uterine enlargement or masses  Neurological: She is alert and oriented to person, place, and time.  Skin: Skin is warm and dry.  Psychiatric: She has a normal mood and affect. Her behavior is normal.    Data Reviewed Path reports, pap  Assessment    H/O CIN III and [place] ca. In situ of cervix. Pain and spotting, suspicious for cervical stenosis. F/U pap, RTC after pelvic US.     Plan    Pelvic US        ARNOLD,JAMES 03/05/2012, 4:42 PM

## 2012-03-06 ENCOUNTER — Encounter: Payer: Self-pay | Admitting: *Deleted

## 2012-03-09 ENCOUNTER — Ambulatory Visit (HOSPITAL_COMMUNITY)
Admission: RE | Admit: 2012-03-09 | Discharge: 2012-03-09 | Disposition: A | Discharge status: Home / Self Care | Payer: Self-pay | Source: Ambulatory Visit | Attending: Obstetrics & Gynecology | Admitting: Obstetrics & Gynecology

## 2012-03-09 DIAGNOSIS — D069 Carcinoma in situ of cervix, unspecified: Secondary | ICD-10-CM | POA: Insufficient documentation

## 2012-03-09 DIAGNOSIS — R102 Pelvic and perineal pain: Secondary | ICD-10-CM

## 2012-03-09 DIAGNOSIS — N949 Unspecified condition associated with female genital organs and menstrual cycle: Secondary | ICD-10-CM | POA: Insufficient documentation

## 2012-03-12 ENCOUNTER — Telehealth: Payer: Self-pay | Admitting: Obstetrics and Gynecology

## 2012-03-12 NOTE — Telephone Encounter (Signed)
Patient called requesting u/s results. Called patient back and no answer- left message to call clinic back.

## 2012-03-14 ENCOUNTER — Telehealth: Payer: Self-pay | Admitting: Obstetrics and Gynecology

## 2012-03-14 MED ORDER — MISOPROSTOL 200 MCG PO TABS: 400.0000 ug | ORAL_TABLET | Freq: Once | ORAL | Status: DC

## 2012-03-14 NOTE — Telephone Encounter (Signed)
Spoke to patient and advised that the results of ultrasound that she is requesting will be discussed in detail on her next appointment that was made specifically for "results". Patient agrees.

## 2012-03-14 NOTE — Telephone Encounter (Signed)
Dr. Debroah Loop wants patient to take Cytotec 400 mg  HS before her next appointment. Order made and sent to pharmacy. Left message on patient's call back.

## 2012-03-19 ENCOUNTER — Ambulatory Visit (INDEPENDENT_AMBULATORY_CARE_PROVIDER_SITE_OTHER): Payer: Self-pay | Admitting: Obstetrics & Gynecology

## 2012-03-19 VITALS — BP 117/79 | HR 76 | Ht 65.0 in | Wt 171.2 lb

## 2012-03-19 DIAGNOSIS — D069 Carcinoma in situ of cervix, unspecified: Secondary | ICD-10-CM

## 2012-03-19 NOTE — Patient Instructions (Signed)
Pelvic Pain Pelvic pain is pain below the belly button and located between your hips. Acute pain may last a few hours or days. Chronic pelvic pain may last weeks and months. The cause may be different for different types of pain. The pain may be dull or sharp, mild or severe and can interfere with your daily activities. Write down and tell your caregiver:   Exactly where the pain is located.  If it comes and goes or is there all the time.  When it happens (with sex, urination, bowel movement, etc.)  If the pain is related to your menstrual period or stress. Your caregiver will take a full history and do a complete physical exam and Pap test. CAUSES   Painful menstrual periods (dysmenorrhea).  Normal ovulation (Mittelschmertz) that occurs in the middle of the menstrual cycle every month.  The pelvic organs get engorged with blood just before the menstrual period (pelvic congestive syndrome).  Scar tissue from an infection or past surgery (pelvic adhesions).  Cancer of the female pelvic organs. When there is pain with cancer, it has been there for a long time.  The lining of the uterus (endometrium) abnormally grows in places like the pelvis and on the pelvic organs (endometriosis).  A form of endometriosis with the lining of the uterus present inside of the muscle tissue of the uterus (adenomyosis).  Fibroid tumor (noncancerous) in the uterus.  Bladder problems such as infection, bladder spasms of the muscle tissue of the bladder.  Intestinal problems (irritable bowel syndrome, colitis, an ulcer or gastrointestinal infection).  Polyps of the cervix or uterus.  Pregnancy in the tube (ectopic pregnancy).  The opening of the cervix is too small for the menstrual blood to flow through it (cervical stenosis).  Physical or sexual abuse (past or present).  Musculo-skeletal problems from poor posture, problems with the vertebrae of the lower back or the uterine pelvic muscles falling  (prolapse).  Psychological problems such as depression or stress.  IUD (intrauterine device) in the uterus. DIAGNOSIS  Tests to make a diagnosis depends on the type, location, severity and what causes the pain to occur. Tests that may be needed include:  Blood tests.  Urine tests  Ultrasound.  X-rays.  CT Scan.  MRI.  Laparoscopy.  Major surgery. TREATMENT  Treatment will depend on the cause of the pain, which includes:  Prescription or over-the-counter pain medication.  Antibiotics.  Birth control pills.  Hormone treatment.  Nerve blocking injections.  Physical therapy.  Antidepressants.  Counseling with a psychiatrist or psychologist.  Minor or major surgery. HOME CARE INSTRUCTIONS   Only take over-the-counter or prescription medicines for pain, discomfort or fever as directed by your caregiver.  Follow your caregiver's advice to treat your pain.  Rest.  Avoid sexual intercourse if it causes the pain.  Apply warm or cold compresses (which ever works best) to the pain area.  Do relaxation exercises such as yoga or meditation.  Try acupuncture.  Avoid stressful situations.  Try group therapy.  If the pain is because of a stomach/intestinal upset, drink clear liquids, eat a bland light food diet until the symptoms go away. SEEK MEDICAL CARE IF:   You need stronger prescription pain medication.  You develop pain with sexual intercourse.  You have pain with urination.  You develop a temperature of 102 F (38.9 C) with the pain.  You are still in pain after 4 hours of taking prescription medication for the pain.  You need depression medication.    Your IUD is causing pain and you want it removed. SEEK IMMEDIATE MEDICAL CARE IF:  You develop very severe pain or tenderness.  You faint, have chills, severe weakness or dehydration.  You develop heavy vaginal bleeding or passing solid tissue.  You develop a temperature of 102 F (38.9 C)  with the pain.  You have blood in the urine.  You are being physically or sexually abused.  You have uncontrolled vomiting and diarrhea.  You are depressed and afraid of harming yourself or someone else. Document Released: 02/11/2004 Document Revised: 03/28/2011 Document Reviewed: 11/08/2007 [employer] Patient Information 2013 [employer].  

## 2012-03-19 NOTE — Progress Notes (Signed)
Patient ID: [name], female   DOB: [birth year], 37 y.o.   MRN: [id number] No LMP recorded. Patient is not currently having periods (Reason: Lactating). Returns today to attempt cervical dilation after taking cytotec. No pain currently.  Past Medical History  Diagnosis Date  . Abnormal Pap smear   . Headache   . HSV infection   . 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 Depression     history - no meds   Past Surgical History  Procedure Laterality Date  . Svd      x 5  . Dilation and curettage of uterus  03/30/2011    Procedure: DILATATION AND CURETTAGE;  Surgeon: Bing Plume, MD;  Location: [facility];  Service: Gynecology;  Laterality: N/A;  . Cervical conization w/bx  03/30/2011    Procedure: CONIZATION CERVIX WITH BIOPSY;  Surgeon: Bing Plume, MD;  Location: [facility];  Service: Gynecology;  Laterality: N/A;  . Cervical conization w/bx  06/08/2011    Procedure: CONIZATION CERVIX WITH BIOPSY;  Surgeon: Bing Plume, MD;  Location: [facility];  Service: Gynecology;  Laterality: N/A;  conization/D & C  . Dilation and curettage of uterus  06/08/2011    Procedure: DILATATION AND CURETTAGE;  Surgeon: Bing Plume, MD;  Location: [facility];  Service: Gynecology;  Laterality: N/A;   Current Outpatient Prescriptions on File Prior to Visit  Medication Sig Dispense Refill  . ibuprofen (ADVIL,MOTRIN) 200 MG tablet Take 200 mg by mouth every 6 (six) hours as needed for pain.      . misoprostol (CYTOTEC) 200 MCG tablet Take 2 tablets (400 mcg total) by mouth once.  2 tablet  0   No current facility-administered medications on file prior to visit.   Allergies  Allergen Reactions  . Latex Itching and Other (See Comments)    Reaction: yeast infection   History   Social History  . Marital Status: Divorced    Spouse Name: N/A    Number of Children: N/A  . Years of Education: N/A   Occupational History  . Not on file.   Social History Main Topics  . Smoking status: Former  Smoker -- 0.25 packs/day for 21 years    Types: Cigarettes    Quit date: 09/18/2010  . Smokeless tobacco: Never Used  . Alcohol Use: No  . Drug Use: No  . Sexually Active: Not Currently    Birth Control/ Protection: IUD   Other Topics Concern  . Not on file   Social History Narrative  . No narrative on file   Family History  Problem Relation Age of Onset  . Heart disease Mother   . Thyroid disease Mother   . Bipolar disorder Father   . Diabetes Brother    Genito-Urinary ROS: no dysuria, trouble voiding, or hematuria negative for - pelvic pain or discharge or bleeding Filed Vitals:   03/19/12 1329  BP: 117/79  Pulse: 76   NAD pleasant  Pelvic: vagina norma except scant white discharge. Cervix is scarred and flush with mucosa, cannot identify os with small probe. Uterus retroverted, normal size, no mass   *RADIOLOGY REPORT*  Clinical Data: Cyclic pain and spotting post conization.  Adenocarcinoma in situ of the cervix.  TRANSABDOMINAL AND TRANSVAGINAL ULTRASOUND OF PELVIS  Technique: Both transabdominal and transvaginal ultrasound  examinations of the pelvis were performed. Transabdominal technique  was performed for global imaging of the pelvis including uterus,  ovaries, adnexal regions, and pelvic cul-de-sac.  It was necessary to proceed with endovaginal exam following the  transabdominal exam to visualize the myometrium, endometrium and  adnexa.  Comparison: None  Findings:  Uterus: Is retroverted and retroflexed and demonstrates a sagittal  length of 8.9 cm, depth of 4.7 cm and width of 5.3 cm. A  homogeneous myometrium is seen. The cervical stroma has a normal  appearance  Endometrium: There is fluid identified in the endocervical canal  which contains some particulate matter suggesting blood or  proteinaceous fluid. A normal endometrial lining is seen and is  homogeneously echogenic with a width of 7 mm.  Right ovary: Has a normal appearance measuring 2.3 x  1.5 x 1.9 cm.  Left ovary: Appears normal measuring 2.3 x 1.5x 2.3 cm  Other findings: In the left adnexa there is a thick-walled  serpiginous structure identified which contains diffuse low level  echoes and demonstrates some echogenic nodularity of the wall. The  appearance is compatible with a hemato or pyosalpinx. The complex  measures 2.7 by 5.3 x 3.0 cm. A similar appearing tubular  structure is identified on the right and also contains diffuse low  level echoes with some echogenic nodular wall thickening this  complex measures 2.9 x 2.1 by 1.7 cm.  No free pelvic fluid is seen.  IMPRESSION:  Blood products identified within the endocervical canal. This is  associated with a normal appearance to the endometrial canal but  bilateral tubular structures filled with low level material  suggesting hemato or pyosalpinx. This constellation of findings  may be the result of relative cervical stenosis with retrograde  filling of the tubes allowing a nondistended endometrium.   Alternatively the presence of the tubular lesions in the adnexa  raise concern for tubal infection in the appropriate clinical  setting.  Normal ovaries.  Original Report Authenticated By: Rhodia Albright, M.D.  Imp Cyclic pain, cervical stenosis.H/O CIN III and adenocarcinoma in situ. Unable to dilate cervix.  Plan: GYN onc referral for recommended management.  ARNOLD,JAMES 03/19/2012 3:30 PM

## 2012-03-19 NOTE — Telephone Encounter (Signed)
Patient came for appt today. She took cytotec

## 2012-03-22 ENCOUNTER — Telehealth: Payer: Self-pay | Admitting: *Deleted

## 2012-03-22 NOTE — Telephone Encounter (Addendum)
Pt left message of wanting to know if her appt with other doctor has been scheduled. She also has a questions about Cytotec.  03/23/12  1045- called pt and discussed her concerns. I informed her of appt on 04/05/12 @ 0900 w/Dr. Duard Brady. Pt stated that she can only have appt in afternoon on m-w-f. I told pt that I will notify that department and they will call her with new appt info. Pt also asked if the cytotec could be used to dilate her cervix so that she can have a menstrual flow. I told pt that I am not aware of that indication for cytotec and recommended that she discuss her question @ the time of her visit w. Dr. Duard Brady. Pt voiced understanding of all information given. Message left for Telford Nab RN to call pt with new appt.

## 2012-03-29 ENCOUNTER — Telehealth: Payer: Self-pay

## 2012-03-29 NOTE — Telephone Encounter (Signed)
I spoke with the patient who has a gyn oncology referral appointment on 04/11/12.  She was instructed to keep her appointment on the 26th and that cytotec would not be beneficial to her at this point.  Patient voiced understanding.

## 2012-03-29 NOTE — Telephone Encounter (Signed)
Patient called and requests that Dr. Debroah Loop call her in a prescription  cytotec to open up her cervix for her period.  "Can he call in cytotec to see if it helps with my period?"

## 2012-04-05 ENCOUNTER — Ambulatory Visit: Payer: Self-pay | Admitting: Gynecologic Oncology

## 2012-04-11 ENCOUNTER — Encounter: Payer: Self-pay | Admitting: Gynecologic Oncology

## 2012-04-11 ENCOUNTER — Ambulatory Visit: Payer: Self-pay | Attending: Gynecologic Oncology | Admitting: Gynecologic Oncology

## 2012-04-11 ENCOUNTER — Other Ambulatory Visit (HOSPITAL_COMMUNITY)
Admission: RE | Admit: 2012-04-11 | Discharge: 2012-04-11 | Disposition: A | Discharge status: Home / Self Care | Payer: Self-pay | Source: Ambulatory Visit | Attending: Gynecologic Oncology | Admitting: Gynecologic Oncology

## 2012-04-11 VITALS — BP 116/72 | HR 70 | Temp 97.5°F | Resp 16 | Ht 64.5 in | Wt 164.0 lb

## 2012-04-11 DIAGNOSIS — N871 Moderate cervical dysplasia: Secondary | ICD-10-CM | POA: Insufficient documentation

## 2012-04-11 DIAGNOSIS — Z01419 Encounter for gynecological examination (general) (routine) without abnormal findings: Secondary | ICD-10-CM | POA: Insufficient documentation

## 2012-04-11 NOTE — Patient Instructions (Signed)
Cervical Dysplasia Cervical dysplasia is a condition in which a woman has abnormal changes in the cells of her cervix. The cervix is the opening to the uterus (womb) between the vagina and the uterus. These changes are called cervical dysplasia and may be the first signs of cervical cancer. These cells can be taken from the cervix during a Pap test and then looked at under a microscope. With early detection, treatment, and close follow-up care, nearly all cervical dysplasia can be cured. If untreated, the mild to moderate stages of dysplasia often grow more severe.  RISK FACTORS  The following increase the risk for cervical dysplasia.  Having had a sexually transmitted disease, including:  Chlamydia.  Human papilloma virus (HPV).  Becoming sexually active before age 18.  Having had more than 1 sexual partner.  Not using protection, such as condoms, during sexual intercourse, especially with new sexual partners.  Having had cancer of the vagina or vulva.  Having a sexual partner whose previous partner had cancer of the cervix or cervical dysplasia.  Having a sexual partner who has or has had cancer of the penis.  Having a weakened immune system (HIV, organ transplant).  Being the daughter of a woman who took DES (diethylstilbestrol) during pregnancy.  A history of cervical cancer in a woman's sister or mother.  Smoking.  Having had an abnormal Pap test in the past. SYMPTOMS  There are usually no symptoms. If there are symptoms, they may be vague such as:  Abnormal vaginal discharge.  Bleeding between periods or following intercourse.  Bleeding during menopause.  Pain on intercourse (dyspareunia). DIAGNOSIS   The Pap test is the best way of detecting abnormalities of the cervix.  Biopsy (removing a piece of tissue to look at under the microscope) of the cervix when the Pap test is abnormal or when the Pap test is normal, but the cervix looks abnormal. TREATMENT    Catching and treating the changes early with Pap tests can prevent cervical cancer.  Cryotherapy freezes the abnormal cells with a steel tip instrument.  A laser can be used to remove the abnormal cells.  Loop electrocautery excision procedure (LEEP). This procedure uses a heated electrical loop to remove a cone-like portion of the cervix, including the cervical canal.  For more serious cases of cervical dysplasia, the abnormal tissue may be removed surgically by:  A cone biopsy (by cold knife, laser or LEEP). A procedure in which a portion of the center of the cervix with the cervical canal is removed.  The uterus and cervix are removed (hysterectomy). Your caregiver will advise you regarding the need and timing of Pap tests in your follow-up. Women who have been treated for dysplasia should be closely followed with pelvic exams and Pap tests. During the first year following treatment of cervical dysplasia, Pap tests should be done every 3 to 4 months. In the second year, the schedule is every 6 months, or as recommended by your caregiver. See your caregiver for new or worsening problems. HOME CARE INSTRUCTIONS   Follow the instructions and recommendations of your caregiver regarding medicines and follow-up appointments.  Only take over-the-counter or prescription medicines for pain or discomfort as directed by your caregiver.  Cramping and pelvic discomfort may follow cryotherapy. It is not abnormal to have watery discharge for several weeks after.  Laser, cone surgery, cryotherapy or LEEP can cause a bad smelling vaginal discharge. It may also cause vaginal bleeding for a couple weeks following the procedure. The   discharge may be black from the paste used to control bleeding from the cone site. This is normal.  Do not use tampons, have sexual intercourse or douche until your caregiver says it is okay. SEEK MEDICAL CARE IF:   You develop genital warts.  You need a prescription for  pain medicine following your treatment. SEEK IMMEDIATE MEDICAL CARE IF:   Your bleeding is heavier than a normal menstrual period.  You develop bright red bleeding, especially if you have blood clots.  You have a fever.  You have increasing cramps or pain not relieved with medicine.  You are lightheaded, unusually weak, or have fainting spells.  You have abnormal vaginal discharge.  You develop abdominal pain. PREVENTION   The surest way to prevent cervical dysplasia is to abstain from sexual intercourse.  Practice safe sex, use condoms and have only one sex partner who does not have other sex partners.  A Pap test is done to screen for cervical cancer.  The first Pap test should be done at age 21.  Between ages 21 and 29, Pap tests are repeated every 2 years.  Beginning at age 30, you are advised to have a Pap test every 3 years as long as your past 3 Pap tests have been normal.  Some women have medical problems that increase the chance of getting cervical cancer. Talk to your caregiver about these problems. It is especially important to talk to your caregiver if a new problem develops soon after your last Pap test. In these cases, your caregiver may recommend more frequent screening and Pap tests.  The above recommendations are the same for women who have or have not gotten the vaccine for HPV (Human Papillomavirus).  If you had a hysterectomy for a problem that was not a cancer or a condition that could lead to cancer, then you no longer need Pap tests. However, even if you no longer need a Pap test, a regular exam is a good idea to make sure no other problems are starting.   If you are between ages 65 and 70, and you have had normal Pap tests going back 10 years, you no longer need Pap tests. However, even if you no longer need a Pap test, a regular exam is a good idea to make sure no other problems are starting.   If you have had past treatment for cervical cancer or a  condition that could lead to cancer, you need Pap tests and screening for cancer for at least 20 years after your treatment.  If Pap tests have been discontinued, risk factors (such as a new sexual partner) need to be re-assessed to determine if screening should be resumed.  Some women may need screenings more often if they are at high risk for cervical cancer.  Your caregiver may do additional tests including:  Colposcopy. A procedure in which a special microscope magnifies the cells and allows the provider to closely examine the cervix, vagina, and vulva.  Biopsy. A small tissue sample is taken from the cervix, vagina or vulva. This is generally done in your caregivers office.  A cone biopsy (cold knife or laser). A large tissue sample is taken from the cervix. This procedure is usually done in an operating room under a general anesthetic. The cone often removes all abnormal tissue and so may also complete the treatment.  LEEP, also removing a circular portion of the cervix and is done in a doctors office under a local anesthetic.  Now   there is a vaccine, Gardasil, that was developed to prevent the HPV'S that can cause cancer of the cervix and genital warts. It is recommended for females ages 9 to 26. It should not be given to pregnant women until more is known about its effects on the fetus. Not all cancers of the cervix are caused by the HPV. Routine gynecology exams and Pap tests should continue as recommended by your caregiver. Document Released: 01/03/2005 Document Revised: 03/28/2011 Document Reviewed: 12/26/2007 [employer] Patient Information 2013 [employer].  

## 2012-04-11 NOTE — Progress Notes (Signed)
Consult Note: Gyn-Onc  [name] 37 y.o. female  CC:  Chief Complaint  Patient presents with  . CIN II    New patient    HPI: Patient is seen today in consultation at the request of Dr. Scheryl Darter.  Patient is a 37 year old gravida 6 para 5 with a long history of cervical abnormalities.  -November 2004 negative Pap smear  -May 2006 low-grade dysplasia HPV negative.  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 D&C, Benign with inflamed endocervix endometrial biopsy was benign  -2/14 normal pap smear  She was referred to [place] secondary to the Pap smear not having an endocervical component secondary to cervical stenosis. Since her daughter was born in December 2012 she's been nursing twice a day and has had some what irregular bleeding. She states she usually takes about a year to have a menstrual cycle after delivery. In December 2013 she began having some cramping as if she would have. He did not have one. It from January to February of this year she was having increasing pain and cramping but no menstrual cycle. It sounds like she has been helpful obstruction in March she began having some cramping again but no bleeding and did notice in the middle part of the month a little blood when she was wiping after voiding she would then was at home and felt a pressure like she needed to push. She did also develop and a significant amount of blood with red clots came out. She did call the emergency room at [facility] hospital and as the bleeding had slowed down to encourage her to continue with followup. It then became a normal. She blood for 5-6 days and she's had no bleeding since that time. She is currently not sexually active she's not  using anything for contraception. 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   Current Meds:  Outpatient Encounter Prescriptions as of 04/11/2012  Medication Sig Dispense Refill  . fexofenadine-pseudoephedrine (ALLEGRA-D) 60-120 MG per tablet Take 1 tablet by mouth daily as needed.      Marland Kitchen ibuprofen (ADVIL,MOTRIN) 200 MG tablet Take 200 mg by mouth every 6 (six) hours as needed for pain.      . misoprostol (CYTOTEC) 200 MCG tablet Take 2 tablets (400 mcg total) by mouth once.  2 tablet  0   No facility-administered encounter medications on file as of 04/11/2012.    Allergy:  Allergies  Allergen Reactions  . Latex Itching and Other (See Comments)    Reaction: yeast infection    Social Hx:   History   Social History  . Marital Status: Divorced    Spouse Name: N/A    Number of Children: N/A  . Years of Education: N/A   Occupational History  . Not on file.   Social History Main Topics  . Smoking status: Former Smoker -- 0.25 packs/day for 21 years    Types: Cigarettes    Quit date: 09/18/2010  . Smokeless tobacco: Never Used  . Alcohol Use: No  . Drug Use: No  . Sexually Active: Not Currently    Birth Control/ Protection: IUD   Other Topics Concern  . Not on file  Social History Narrative  . No narrative on file    Past Surgical Hx:  Past Surgical History  Procedure Laterality Date  . Svd      x 5  . Dilation and curettage of uterus  03/30/2011    Procedure: DILATATION AND CURETTAGE;  Surgeon: Bing Plume, MD;  Location: [facility];  Service: Gynecology;  Laterality: N/A;  . Cervical conization w/bx  03/30/2011    Procedure: CONIZATION CERVIX WITH BIOPSY;  Surgeon: Bing Plume, MD;  Location: [facility];  Service: Gynecology;  Laterality: N/A;  . Cervical conization w/bx  06/08/2011    Procedure: CONIZATION CERVIX WITH BIOPSY;  Surgeon: Bing Plume, MD;  Location: [facility];  Service: Gynecology;  Laterality:  N/A;  conization/D & C  . Dilation and curettage of uterus  06/08/2011    Procedure: DILATATION AND CURETTAGE;  Surgeon: Bing Plume, MD;  Location: [facility];  Service: Gynecology;  Laterality: N/A;    Past Medical Hx:  Past Medical History  Diagnosis Date  . Abnormal Pap smear   . Headache   . HSV infection   . 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 Depression     history - no meds    Family Hx:  Family History  Problem Relation Age of Onset  . Heart disease Mother   . Thyroid disease Mother   . Bipolar disorder Father   . Diabetes Brother     Vitals:  Blood pressure 116/72, pulse 70, temperature 97.5 F (36.4 C), resp. rate 16, height 5' 4.5" (1.638 m), weight 164 lb (74.39 kg), last menstrual period 03/29/2012.  Physical Exam: Well-nourished well-developed female in no acute distress.  Abdomen: Soft, nontender, nondistended.  Pelvic: Normal external female genitalia. The cervix is very foreshortened. There is no gross visible lesions. Endocervical specimen for cytology was submitted without difficulty. Bimanual examination reveals significantly foreshortened cervix with a short posterior lip. Corpus is of normal size shape and consistency there is no adnexal masses  Assessment/Plan: 37 year old with history of cervical dysplasia and adenocarcinoma in situ. She has issues with not having an endocervical component of her Pap smear secondary cervical stenosis. However, that appears to have recently somewhat resolved itself. I discussed with her that as she no longer wants any additional children that the most definitive and appropriate treatment would be a hysterectomy  salpingectomy. She states that she agrees with this however secondary timing due to having no other family at home, being single, and going to school this would not be something that she go to consider doing this year.   I discussed with her that if the Pap smear endocervical component today is normal, she would  need close followup with Dr. Debroah Loop and then at that point she can undergo vaginal hysterectomy versus laparoscopic hysterectomy for definitive diagnosis.She is worried about having regret from having a hysterectomy. She will discuss this with Dr. Debroah Loop.  She will followup with [place] on a when necessary basis. I will make sure that Dr. Debroah Loop has a copy of the Pap smear from today. Cleda Mccreedy A., MD 04/11/2012, 3:49 PM

## 2012-04-11 NOTE — Progress Notes (Signed)
Met with pt with financial concerns.  Her kid's case worker at [facility] gave her an application to fill out and I gave her an EPP application.  I will process it when she returns it with the needed info.

## 2012-04-20 ENCOUNTER — Telehealth: Payer: Self-pay | Admitting: *Deleted

## 2012-04-20 NOTE — Telephone Encounter (Signed)
Notified patient of Pap results 

## 2012-05-31 ENCOUNTER — Encounter: Payer: Self-pay | Admitting: Gynecologic Oncology

## 2012-05-31 NOTE — Progress Notes (Signed)
Mailed another EPP application to pt today.

## 2012-11-22 ENCOUNTER — Other Ambulatory Visit: Payer: Self-pay

## 2013-11-01 ENCOUNTER — Other Ambulatory Visit: Payer: Self-pay

## 2013-11-18 ENCOUNTER — Encounter: Payer: Self-pay | Admitting: Gynecologic Oncology

## 2013-12-15 IMAGING — US US TRANSVAGINAL NON-OB
1 series · 13 of 25 positions shown · non-contrast
Comparison: None

CLINICAL DATA: Cyclic pain and spotting post conization.
Adenocarcinoma in situ of the cervix.



[Series 1: us transvaginal non-ob · 88 acquisitions, 13 frames shown]
[im 1/88]
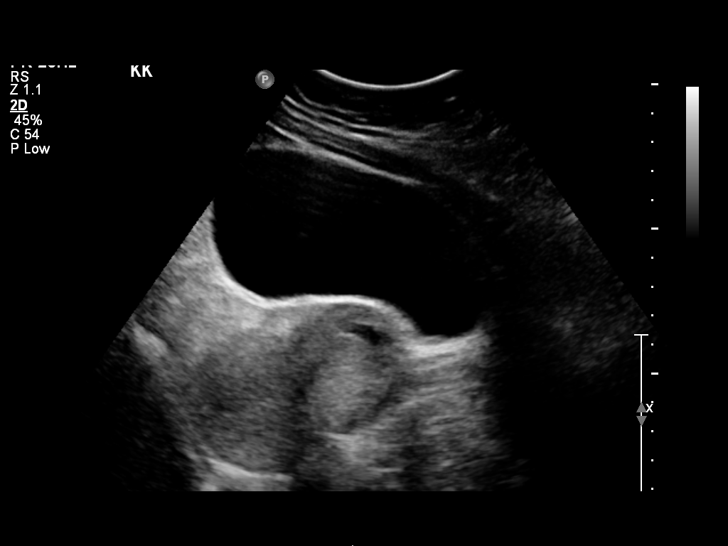
[im 8/88]
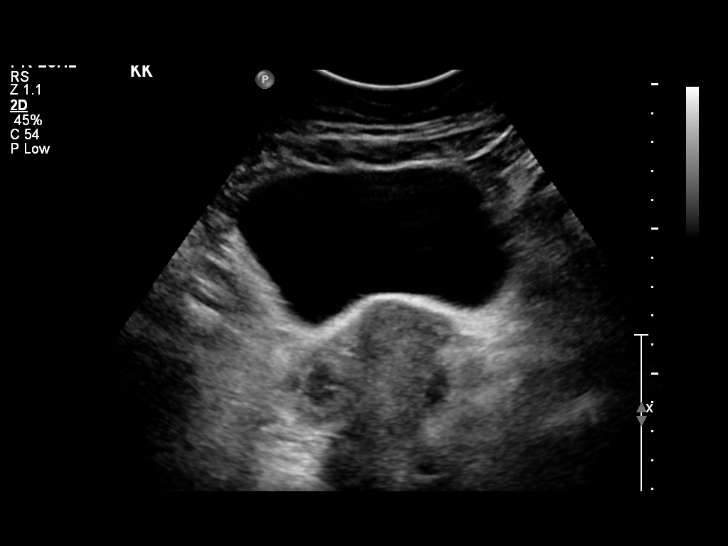
[im 15/88]
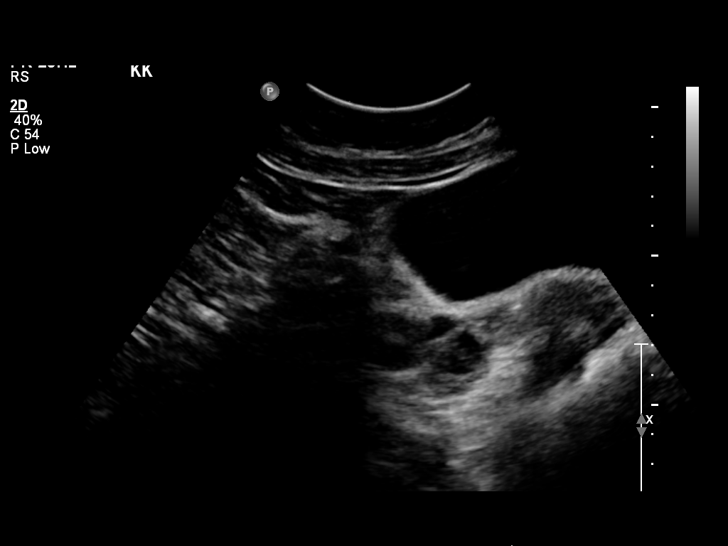
[im 22/88]
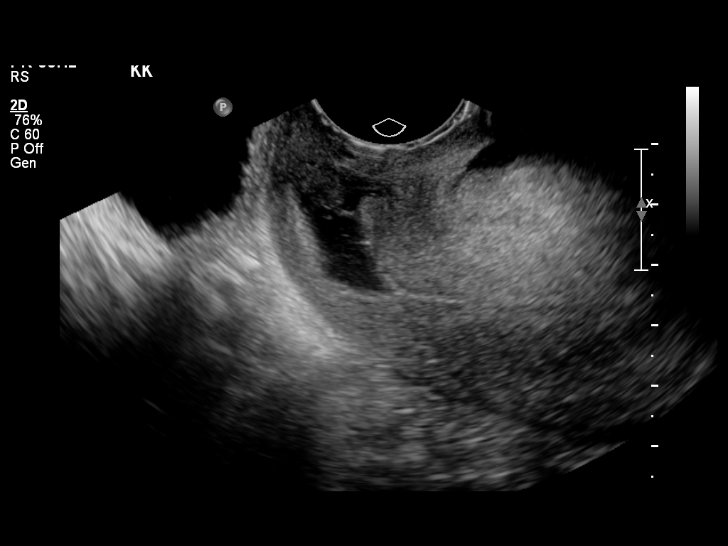
[im 30/88]
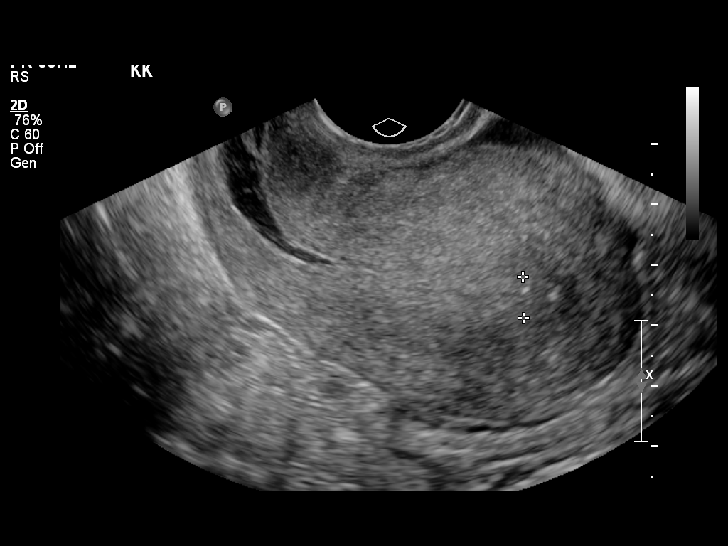
[im 37/88]
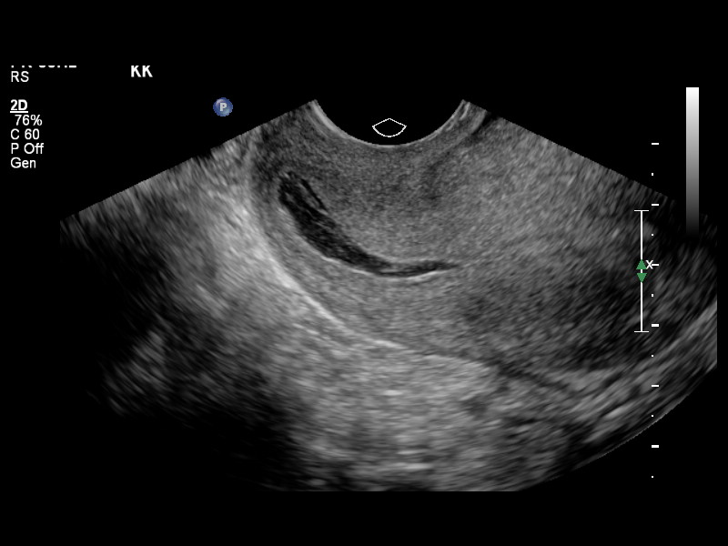
[im 44/88]
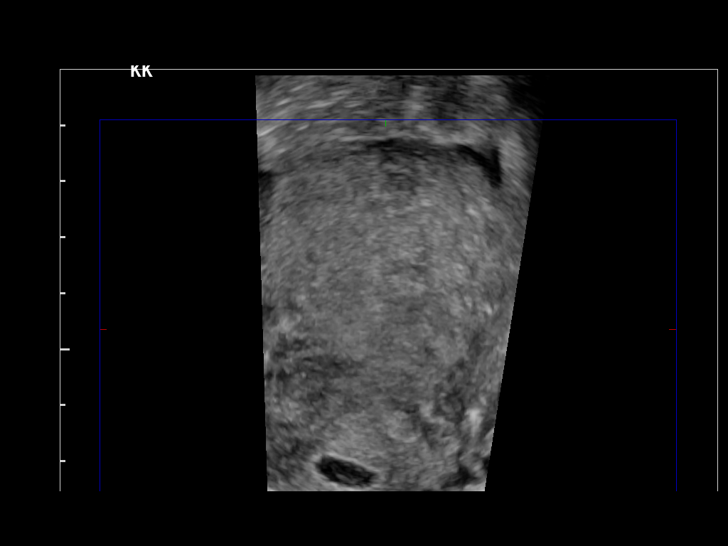
[im 51/88]
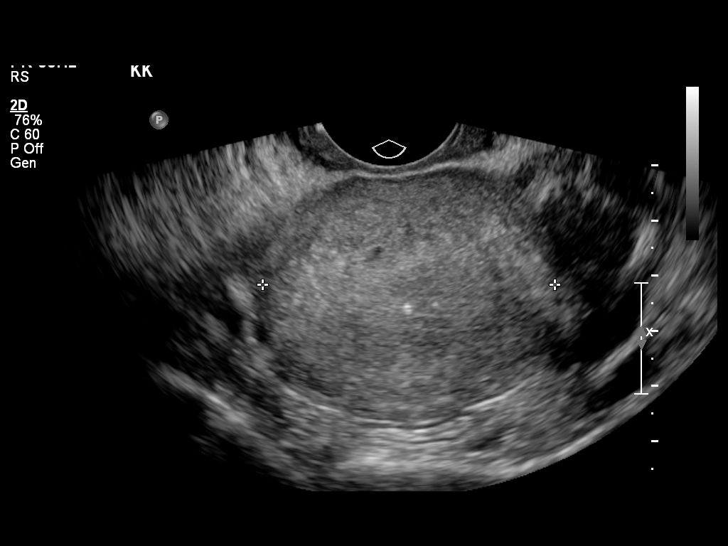
[im 59/88]
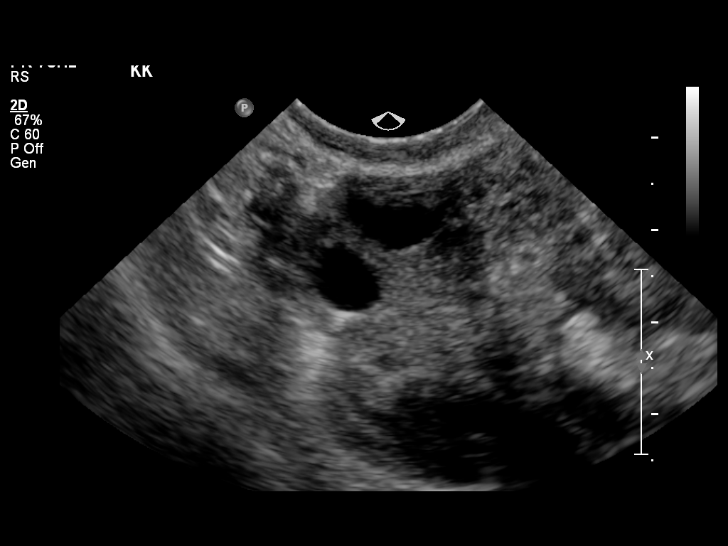
[im 66/88]
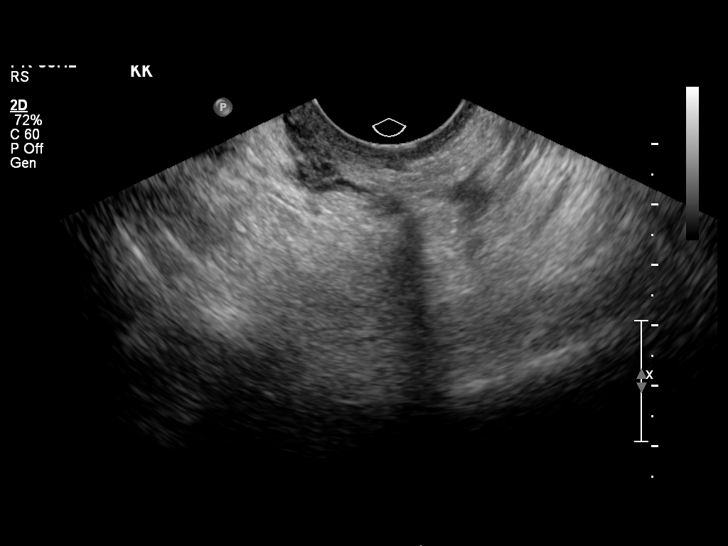
[im 73/88]
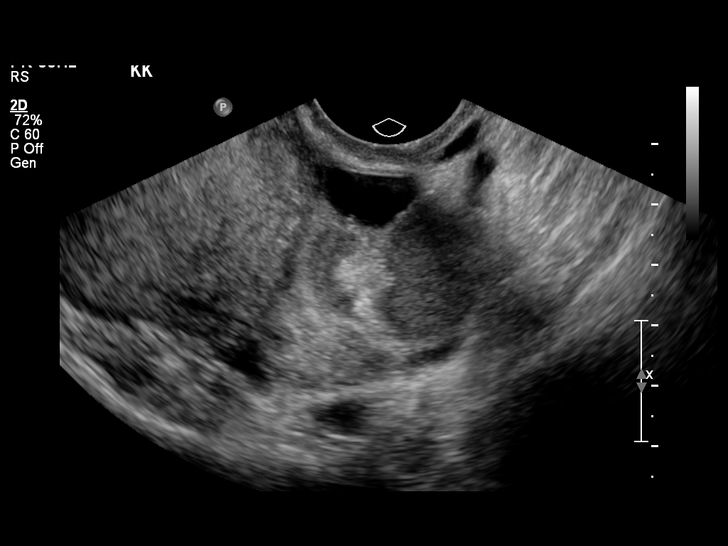
[im 80/88]
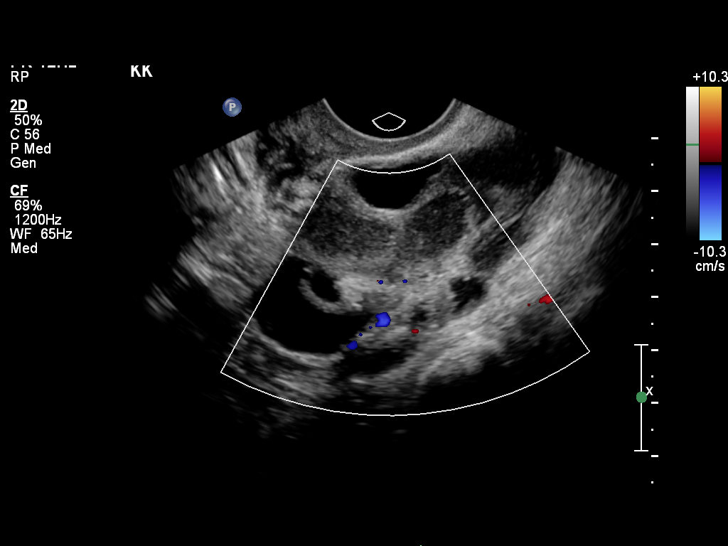
[im 88/88]
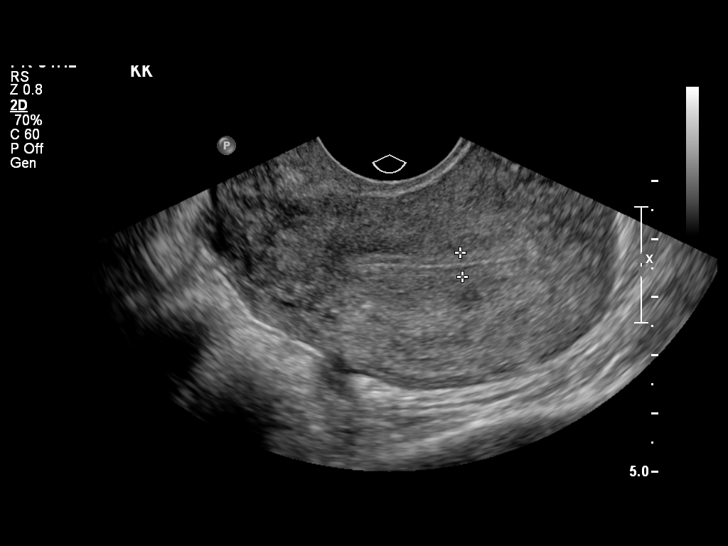

[13 of 25 positions shown; findings below may reference images not displayed]

FINDINGS: Uterus: Is retroverted and retroflexed and demonstrates a sagittal
length of 8.9 cm, depth of 4.7 cm and width of 5.3 cm.  A
homogeneous myometrium is seen.  The cervical stroma has a normal
appearance

Endometrium: There is fluid identified in the endocervical canal
which contains some particulate matter suggesting blood or
proteinaceous fluid. A normal endometrial lining is seen and is
homogeneously echogenic with a width of 7 mm.

Right ovary:  Has a normal appearance measuring 2.3 x 1.5 x 1.9 cm.

Left ovary: Appears normal measuring 2.3 x 1.5x 2.3 cm

Other findings: In the left adnexa there is a thick-walled
serpiginous structure identified which contains diffuse low level
echoes and demonstrates some echogenic nodularity of the wall.  The
appearance is compatible with a hemato or pyosalpinx. The complex
measures 2.7 by 5.3 x 3.0 cm.  A similar appearing tubular
structure is identified on the right and also contains diffuse low
level echoes with some echogenic nodular wall thickening this
complex measures 2.9 x 2.1 by 1.7 cm.

No free pelvic fluid is seen.
IMPRESSION: Blood products identified within the endocervical canal.  This is
associated with a normal appearance to the endometrial canal but
bilateral tubular structures filled with low level material
suggesting hemato or pyosalpinx.  This constellation of findings
may be the result of relative cervical stenosis with retrograde
filling of the tubes allowing a nondistended endometrium.
Alternatively the presence of the tubular lesions in the adnexa
raise concern for tubal infection in the appropriate clinical
setting.

Normal ovaries.

## 2014-09-23 ENCOUNTER — Other Ambulatory Visit: Payer: Self-pay | Admitting: Family Medicine

## 2014-09-23 ENCOUNTER — Other Ambulatory Visit (HOSPITAL_COMMUNITY)
Admission: RE | Admit: 2014-09-23 | Discharge: 2014-09-23 | Disposition: A | Discharge status: Home / Self Care | Payer: 59 | Source: Ambulatory Visit | Attending: Family Medicine | Admitting: Family Medicine

## 2014-09-23 DIAGNOSIS — Z124 Encounter for screening for malignant neoplasm of cervix: Secondary | ICD-10-CM | POA: Diagnosis not present

## 2014-09-26 LAB — CYTOLOGY - PAP

## 2015-02-20 ENCOUNTER — Ambulatory Visit: Payer: Self-pay | Admitting: Physician Assistant

## 2016-09-06 ENCOUNTER — Telehealth: Payer: Self-pay | Admitting: *Deleted

## 2016-09-06 NOTE — Telephone Encounter (Signed)
Patient requested an appt to see the doctor and have any exam done. Appt made for September 12th.

## 2016-09-12 DIAGNOSIS — Z0100 Encounter for examination of eyes and vision without abnormal findings: Secondary | ICD-10-CM | POA: Diagnosis not present

## 2016-09-28 ENCOUNTER — Other Ambulatory Visit: Payer: Self-pay | Admitting: Gynecologic Oncology

## 2016-09-28 ENCOUNTER — Other Ambulatory Visit (HOSPITAL_COMMUNITY)
Admission: RE | Admit: 2016-09-28 | Discharge: 2016-09-28 | Disposition: A | Discharge status: Home / Self Care | Payer: 59 | Source: Ambulatory Visit | Attending: Gynecologic Oncology | Admitting: Gynecologic Oncology

## 2016-09-28 ENCOUNTER — Ambulatory Visit: Payer: 59 | Attending: Gynecologic Oncology | Admitting: Gynecologic Oncology

## 2016-09-28 ENCOUNTER — Encounter: Payer: Self-pay | Admitting: Gynecologic Oncology

## 2016-09-28 VITALS — BP 116/72 | HR 53 | Temp 98.6°F | Resp 18 | Ht 64.5 in | Wt 158.4 lb

## 2016-09-28 DIAGNOSIS — Z87891 Personal history of nicotine dependence: Secondary | ICD-10-CM | POA: Diagnosis not present

## 2016-09-28 DIAGNOSIS — Z87898 Personal history of other specified conditions: Secondary | ICD-10-CM | POA: Insufficient documentation

## 2016-09-28 DIAGNOSIS — Z01419 Encounter for gynecological examination (general) (routine) without abnormal findings: Secondary | ICD-10-CM | POA: Diagnosis not present

## 2016-09-28 DIAGNOSIS — Z87410 Personal history of cervical dysplasia: Secondary | ICD-10-CM | POA: Diagnosis not present

## 2016-09-28 DIAGNOSIS — N871 Moderate cervical dysplasia: Secondary | ICD-10-CM

## 2016-09-28 NOTE — Progress Notes (Signed)
Consult Note: Gyn-Onc  [name] 41 y.o. female  CC:  Chief Complaint  Patient presents with  . Follow-up    HPI: Patient is seen today as a self-referral she is due for a Pap smear.  Patient is a 41 year old gravida 6 para 5 with a long history of cervical abnormalities.  -November 2004 negative Pap smear  -May 2006 low-grade dysplasia HPV negative.  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 D&C, Benign with inflamed endocervix endometrial biopsy was benign  -2/14 normal pap smear  Her last Pap smear was in September 2016 by Dr. Bernita Raisin and it was negative and satisfactory. She had asked forgotten that she has had a Pap smear in 2016. She had lost insurance for period of time and now has again. She does have some bumps on her left vulva that she wants [place] to evaluate. Her cycles are fairly regular started today. She does have cramps with her cycles but that's no different. She is sexually active and denies any postcoital bleeding or dyspareunia. She has no change in her bowel or bladder habits. She does occasionally have migraines. She is asking for more holistic provider that she does not want to take medications necessarily for minor ailments. Since we last saw her there are no interval changes in her family history or surgical history.  Current Meds:  Outpatient Encounter Prescriptions as of 09/28/2016  Medication Sig  . ibuprofen (ADVIL,MOTRIN) 200 MG tablet Take 200 mg by mouth every 6 (six) hours as needed for pain.  . NORETHINDRONE ACET-ETHINYL EST PO Take by mouth. Take as directed.  . [DISCONTINUED] fexofenadine-pseudoephedrine (ALLEGRA-D) 60-120 MG per tablet Take 1 tablet by mouth daily as  needed.  . [DISCONTINUED] misoprostol (CYTOTEC) 200 MCG tablet Take 2 tablets (400 mcg total) by mouth once.   No facility-administered encounter medications on file as of 09/28/2016.     Allergy:  Allergies  Allergen Reactions  . Latex Itching and Other (See Comments)    Reaction: yeast infection    Social Hx:   Social History   Social History  . Marital status: Divorced    Spouse name: N/A  . Number of children: N/A  . Years of education: N/A   Occupational History  . Not on file.   Social History Main Topics  . Smoking status: Former Smoker    Packs/day: 0.25    Years: 21.00    Types: Cigarettes    Quit date: 09/18/2010  . Smokeless tobacco: Never Used  . Alcohol use No  . Drug use: No  . Sexual activity: Not Currently    Birth control/ protection: IUD   Other Topics Concern  . Not on file   Social History Narrative  . No narrative on file    Past Surgical Hx:  Past Surgical History:  Procedure Laterality Date  . CERVICAL CONIZATION W/BX  03/30/2011   Procedure: CONIZATION CERVIX WITH BIOPSY;  Surgeon: Bing Plume, MD;  Location: [facility];  Service: Gynecology;  Laterality: N/A;  . CERVICAL CONIZATION W/BX  06/08/2011   Procedure: CONIZATION CERVIX WITH BIOPSY;  Surgeon: Bing Plume, MD;  Location: [facility];  Service: Gynecology;  Laterality: N/A;  conization/D & C  . DILATION AND CURETTAGE OF UTERUS  03/30/2011   Procedure: DILATATION AND CURETTAGE;  Surgeon: Bing Plumehomas F Henley, MD;  Location: [facility];  Service: Gynecology;  Laterality: N/A;  . DILATION AND CURETTAGE OF UTERUS  06/08/2011   Procedure: DILATATION AND CURETTAGE;  Surgeon: Bing Plumehomas F Henley, MD;  Location: [facility];  Service: Gynecology;  Laterality: N/A;  . SVD     x 5    Past Medical Hx:  Past Medical History:  Diagnosis Date  . Abnormal Pap smear   . 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 Depression    history - no meds  . Headache(784.0)   . HSV infection     Oncology Hx:   No history  exists.    Family Hx:  Family History  Problem Relation Age of Onset  . Heart disease Mother   . Thyroid disease Mother   . Bipolar disorder Father   . Diabetes Brother     Vitals:  Blood pressure 116/72, pulse (!) 53, temperature 98.6 F (37 C), temperature source Oral, resp. rate 18, height 5' 4.5" (1.638 m), weight 158 lb 6.4 oz (71.8 kg), SpO2 99 %.  Physical Exam: Well-nourished well-developed female in no acute distress  Abdomen: Soft, nontender, nondistended. No palpable masses.  Groins: No lymphadenopathy.  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 There are a few small next from the razor. The vagina is well epithelialized. There is menstrual flow. The cervix is quite anteverted. The cervix is visualized are no visible lesions. There is menstrual flow. Pap smear smooth without difficulty. Bimanual examination the uterus is retroflexed. The cervix is palpably normal. The uterus is slightly globular with a questionable small myoma posteriorly. There are no adnexal masses.  Assessment/Plan: 41 year old with a distant history of cervical dysplasia who most recently has had a normal Pap smear 2014 in 2016. She comes in today for follow-up. We will notify her of the results of her Pap smear. She was given names of more holistic and integrative providers so she can see them for routine care. If her Pap smear from today is normal she'll be released from our clinic and she knows that we'll be happy to see her in the future should the need arise.  Jolette Lana A., MD 09/28/2016, 2:35 PM

## 2016-09-28 NOTE — Patient Instructions (Addendum)
[place]. We will call you with your pap smear results.

## 2016-09-30 LAB — CYTOLOGY - PAP
Adequacy: ABSENT
Diagnosis: NEGATIVE
HPV (WINDOPATH): NOT DETECTED

## 2016-11-10 ENCOUNTER — Encounter: Payer: Self-pay | Admitting: Neurology

## 2016-11-10 ENCOUNTER — Ambulatory Visit (INDEPENDENT_AMBULATORY_CARE_PROVIDER_SITE_OTHER): Payer: 59 | Admitting: Neurology

## 2016-11-10 DIAGNOSIS — G43709 Chronic migraine without aura, not intractable, without status migrainosus: Secondary | ICD-10-CM | POA: Diagnosis not present

## 2016-11-10 DIAGNOSIS — IMO0002 Reserved for concepts with insufficient information to code with codable children: Secondary | ICD-10-CM

## 2016-11-10 MED ORDER — NORTRIPTYLINE HCL 10 MG PO CAPS: 20.0000 mg | ORAL_CAPSULE | Freq: Every day | ORAL | 11 refills | Status: DC

## 2016-11-10 MED ORDER — RIZATRIPTAN BENZOATE 5 MG PO TBDP: 5.0000 mg | ORAL_TABLET | ORAL | 12 refills | Status: DC | PRN

## 2016-11-10 NOTE — Progress Notes (Signed)
PATIENT: Rose SorensonJennifer L Spencer DOB: October 17, 1975  Chief Complaint  Patient presents with  . Migraine    She estimates 1- 4 migraines per month that cause sensitivity to light.  She treats pain with rest in a dark room and ibuprofen.  Marland Kitchen. Optometry    Edd Fabianotten, [place], [place] - referring provider  . PCP    No established PCP.     HISTORICAL  Rose SorensonJennifer L Kendrick is a 41 year old female, seen in refer by her optometrist Dr. Edd Fabianotten, [place] for evaluation of migraine, initial evaluation was November 10 2016.  She reported a history of migraine headache since age 89, her typical migraine are bilateral retro-orbital area severe pounding headache with associated light noise sensitivity, nauseous, lasting for a few hours, she has been taking Advil, but required 3-4 tablets with incomplete improvement of her migraine.  Common trigger for her migraine of bright light, hungry, menstruation, stress, sleep deprivation.  Now she is having migraine about 3-4 times each month, she works as a Dance movement psychotherapisthome CNA, tends to miss her regular meal.   REVIEW OF SYSTEMS: Full 14 system review of systems performed and notable only for Blurry vision, double vision, headaches, restless leg  ALLERGIES: Allergies  Allergen Reactions  . Latex Itching and Other (See Comments)    Reaction: yeast infection    HOME MEDICATIONS: Current Outpatient Prescriptions  Medication Sig Dispense Refill  . ibuprofen (ADVIL,MOTRIN) 200 MG tablet Take 200 mg by mouth every 6 (six) hours as needed for pain.    . NORETHINDRONE ACET-ETHINYL EST PO Take by mouth. Take as directed.     No current facility-administered medications for this visit.     PAST MEDICAL HISTORY: Past Medical History:  Diagnosis Date  . Abnormal Pap smear   . 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 Depression    history - no meds  . Headache(784.0)   . HSV infection   . Migraine     PAST SURGICAL HISTORY: Past Surgical History:  Procedure Laterality Date  .  CERVICAL CONIZATION W/BX  03/30/2011   Procedure: CONIZATION CERVIX WITH BIOPSY;  Surgeon: Bing Plumehomas F Henley, MD;  Location: [facility];  Service: Gynecology;  Laterality: N/A;  . CERVICAL CONIZATION W/BX  06/08/2011   Procedure: CONIZATION CERVIX WITH BIOPSY;  Surgeon: Bing Plumehomas F Henley, MD;  Location: [facility];  Service: Gynecology;  Laterality: N/A;  conization/D & C  . DILATION AND CURETTAGE OF UTERUS  03/30/2011   Procedure: DILATATION AND CURETTAGE;  Surgeon: Bing Plumehomas F Henley, MD;  Location: [facility];  Service: Gynecology;  Laterality: N/A;  . DILATION AND CURETTAGE OF UTERUS  06/08/2011   Procedure: DILATATION AND CURETTAGE;  Surgeon: Bing Plumehomas F Henley, MD;  Location: [facility];  Service: Gynecology;  Laterality: N/A;  . SVD     x 5    FAMILY HISTORY: Family History  Problem Relation Age of Onset  . Heart disease Mother   . Thyroid disease Mother   . Other Mother        benign pituitary tumors  . Hypertension Mother   . Bipolar disorder Father   . Hypertension Father   . Diabetes Brother   . Multiple sclerosis Other     SOCIAL HISTORY:  Social History   Social History  . Marital status: Divorced    Spouse name: N/A  . Number of children: 5  . Years of education: some college   Occupational History  . 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 Smoking status: Current Some Day Smoker    Packs/day: 0.25    Years: 21.00    Types: Cigarettes    Last attempt to quit: 09/18/2010  . Smokeless tobacco: Never Used  . Alcohol use Yes     Comment: Occasionally  . Drug use: No  . Sexual activity: Not Currently    Birth control/ protection: IUD   Other Topics Concern  . Not on file   Social History Narrative   Lives at home with children.   Right-handed.   2 cups caffeine per day.     PHYSICAL EXAM   Vitals:   11/10/16 1050  BP: 119/74  Pulse: (!) 58  Weight: 157 lb (71.2 kg)  Height: 5' 4.5" (1.638 m)    Not recorded      Body mass index is 26.53  kg/m.  PHYSICAL EXAMNIATION:  Gen: NAD, conversant, well nourised, obese, well groomed                     Cardiovascular: Regular rate rhythm, no peripheral edema, warm, nontender. Eyes: Conjunctivae clear without exudates or hemorrhage Neck: Supple, no carotid bruits. Pulmonary: Clear to auscultation bilaterally   NEUROLOGICAL EXAM:  MENTAL STATUS: Speech:    Speech is normal; fluent and spontaneous with normal comprehension.  Cognition:     Orientation to time, place and person     Normal recent and remote memory     Normal Attention span and concentration     Normal Language, naming, repeating,spontaneous speech     Fund of knowledge   CRANIAL NERVES: CN II: Visual fields are full to confrontation. Fundoscopic exam is normal with sharp discs and no vascular changes. Pupils are round equal and briskly reactive to light. CN III, IV, VI: extraocular movement are normal. No ptosis. CN V: Facial sensation is intact to pinprick in all 3 divisions bilaterally. Corneal responses are intact.  CN VII: Face is symmetric with normal eye closure and smile. CN VIII: Hearing is normal to rubbing fingers CN IX, X: Palate elevates symmetrically. Phonation is normal. CN XI: Head turning and shoulder shrug are intact CN XII: Tongue is midline with normal movements and no atrophy.  MOTOR: There is no pronator drift of out-stretched arms. Muscle bulk and tone are normal. Muscle strength is normal.  REFLEXES: Reflexes are 2+ and symmetric at the biceps, triceps, knees, and ankles. Plantar responses are flexor.  SENSORY: Intact to light touch, pinprick, positional sensation and vibratory sensation are intact in fingers and toes.  COORDINATION: Rapid alternating movements and fine finger movements are intact. There is no dysmetria on finger-to-nose and heel-knee-shin.    GAIT/STANCE: Posture is normal. Gait is steady with normal steps, base, arm swing, and turning. Heel and toe walking are  normal. Tandem gait is normal.  Romberg is absent.   DIAGNOSTIC DATA (LABS, IMAGING, TESTING) - I reviewed patient records, labs, notes, testing and imaging myself where available.   ASSESSMENT AND PLAN  BRITANI BEATTIE is a 41 y.o. female    Chronic migraine headaches  Start preventive medication nortriptyline 10 mg titrating to 20 mg every night  maxalt as needed, may combine with Rodena Piety, M.D. Ph.D.  [facility] [address] [place], [place] [address] Ph: [phone] Fax: [phone]  CC: Edd Fabian, OD

## 2017-02-09 NOTE — Progress Notes (Deleted)
[facility]  PATIENT: [name] DOB: January 14, 1976   REASON FOR VISIT: Follow-up for migraine HISTORY FROM:    HISTORY OF PRESENT ILLNESS:11/10/16 Rose BAUMGARNER is a 42 year old female, seen in refer by her optometrist Dr. Edd Fabian for evaluation of migraine, initial evaluation was November 10 2016.  She reported a history of migraine headache since age 70, her typical migraine are bilateral retro-orbital area severe pounding headache with associated light noise sensitivity, nauseous, lasting for a few hours, she has been taking Advil, but required 3-4 tablets with incomplete improvement of her migraine.  Common trigger for her migraine of bright light, hungry, menstruation, stress, sleep deprivation.  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   REVIEW OF SYSTEMS: Full 14 system review of systems performed and notable only for those listed, all others are neg:  Constitutional: neg  Cardiovascular: neg Ear/Nose/Throat: neg  Skin: neg Eyes: neg Respiratory: neg Gastroitestinal: neg  Hematology/Lymphatic: neg  Endocrine: neg Musculoskeletal:neg Allergy/Immunology: neg Neurological: neg Psychiatric: neg Sleep : neg   ALLERGIES: Allergies  Allergen Reactions  . Latex Itching and Other (See Comments)    Reaction: yeast infection    HOME MEDICATIONS: Outpatient Medications Prior to Visit  Medication Sig Dispense Refill  . ibuprofen (ADVIL,MOTRIN) 200 MG tablet Take 200 mg by mouth every 6 (six) hours as needed for pain.    . NORETHINDRONE ACET-ETHINYL EST PO Take by mouth. Take as directed.    . nortriptyline (PAMELOR) 10 MG capsule Take 2 capsules (20 mg total) by mouth at bedtime. 60 capsule 11  . rizatriptan (MAXALT-MLT) 5 MG disintegrating tablet Take 1 tablet (5 mg total) by mouth as needed. May repeat in 2 hours if needed 15 tablet 12   No  facility-administered medications prior to visit.     PAST MEDICAL HISTORY: Past Medical History:  Diagnosis Date  . Abnormal Pap smear   . 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 Depression    history - no meds  . Headache(784.0)   . HSV infection   . Migraine     PAST SURGICAL HISTORY: Past Surgical History:  Procedure Laterality Date  . CERVICAL CONIZATION W/BX  03/30/2011   Procedure: CONIZATION CERVIX WITH BIOPSY;  Surgeon: Bing Plume, MD;  Location: [facility];  Service: Gynecology;  Laterality: N/A;  . CERVICAL CONIZATION W/BX  06/08/2011   Procedure: CONIZATION CERVIX WITH BIOPSY;  Surgeon: Bing Plume, MD;  Location: [facility];  Service: Gynecology;  Laterality: N/A;  conization/D & C  . DILATION AND CURETTAGE OF UTERUS  03/30/2011   Procedure: DILATATION AND CURETTAGE;  Surgeon: Bing Plume, MD;  Location: [facility];  Service: Gynecology;  Laterality: N/A;  . DILATION AND CURETTAGE OF UTERUS  06/08/2011   Procedure: DILATATION AND CURETTAGE;  Surgeon: Bing Plume, MD;  Location: [facility];  Service: Gynecology;  Laterality: N/A;  . SVD     x 5    FAMILY HISTORY: Family History  Problem Relation Age of Onset  . Heart disease Mother   . Thyroid disease Mother   . Other Mother        benign pituitary tumors  . Hypertension Mother   . Bipolar disorder Father   . Hypertension Father   . Diabetes Brother   . Multiple sclerosis Other     SOCIAL HISTORY: Social History   Socioeconomic History  .  Marital status: Divorced    Spouse name: Not on file  . Number of children: 5  . Years of education: some college  . Highest education level: Not on file  Social Needs  . Financial resource strain: Not on file  . Food insecurity - worry: Not on file  . Food insecurity - inability: Not on file  . Transportation needs - medical: Not on file  . Transportation needs - non-medical: Not on file  Occupational History  . 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 Smoking  status: Current Some Day Smoker    Packs/day: 0.25    Years: 21.00    Pack years: 5.25    Types: Cigarettes    Last attempt to quit: 09/18/2010    Years since quitting: 6.4  . Smokeless tobacco: Never Used  Substance and Sexual Activity  . Alcohol use: Yes    Comment: Occasionally  . Drug use: No  . Sexual activity: Not Currently    Birth control/protection: IUD  Other Topics Concern  . Not on file  Social History Narrative   Lives at home with children.   Right-handed.   2 cups caffeine per day.     PHYSICAL EXAM  There were no vitals filed for this visit. There is no height or weight on file to calculate BMI.  Generalized: Well developed, in no acute distress  Head: normocephalic and atraumatic,. Oropharynx benign  Neck: Supple, no carotid bruits  Cardiac: Regular rate rhythm, no murmur  Musculoskeletal: No deformity   Neurological examination   Mentation: Alert oriented to time, place, history taking. Attention span and concentration appropriate. Recent and remote memory intact.  Follows all commands speech and language fluent.   Cranial nerve II-XII: Fundoscopic exam reveals sharp disc margins.Pupils were equal round reactive to light extraocular movements were full, visual field were full on confrontational test. Facial sensation and strength were normal. hearing was intact to finger rubbing bilaterally. Uvula tongue midline. head turning and shoulder shrug were normal and symmetric.Tongue protrusion into cheek strength was normal. Motor: normal bulk and tone, full strength in the BUE, BLE, fine finger movements normal, no pronator drift. No focal weakness Sensory: normal and symmetric to light touch, pinprick, and  Vibration, proprioception  Coordination: finger-nose-finger, heel-to-shin bilaterally, no dysmetria Reflexes: Brachioradialis 2/2, biceps 2/2, triceps 2/2, patellar 2/2, Achilles 2/2, plantar responses were flexor bilaterally. Gait and Station: Rising up from  seated position without assistance, normal stance,  moderate stride, good arm swing, smooth turning, able to perform tiptoe, and heel walking without difficulty. Tandem gait is steady  DIAGNOSTIC DATA (LABS, IMAGING, TESTING) - I reviewed patient records, labs, notes, testing and imaging myself where available.  Lab Results  Component Value Date   WBC 6.8 06/08/2011   HGB 12.5 06/08/2011   HCT 37.3 06/08/2011   MCV 90.1 06/08/2011   PLT 211 06/08/2011      Component Value Date/Time   NA 139 03/30/2011 0820   K 4.0 03/30/2011 0820   CL 106 03/30/2011 0820   CO2 28 03/30/2011 0820   GLUCOSE 89 03/30/2011 0820   BUN 12 03/30/2011 0820   CREATININE 0.84 03/30/2011 0820   CALCIUM 8.9 03/30/2011 0820   PROT 6.5 03/30/2011 0820   ALBUMIN 3.5 03/30/2011 0820   AST 16 03/30/2011 0820   ALT 23 03/30/2011 0820   ALKPHOS 72 03/30/2011 0820   BILITOT 0.2 (L) 03/30/2011 0820   GFRNONAA >90 03/30/2011 0820   GFRAA >90 03/30/2011  0820    ASSESSMENT AND PLAN  Laverda SorensonJennifer L Music is a 42 y.o. female    Chronic migraine headaches             Start preventive medication nortriptyline 10 mg titrating to 20 mg every night             maxalt as needed, may combine with aleve  Nilda RiggsNancy Carolyn Blake Vetrano, [facility] CenterGNP, [facility] InstituteBC, APRN  [facility] [address], [address] [place], [place] [id number] [phone]

## 2017-02-10 ENCOUNTER — Ambulatory Visit: Payer: 59 | Admitting: Nurse Practitioner

## 2017-02-13 ENCOUNTER — Encounter: Payer: Self-pay | Admitting: Nurse Practitioner

## 2017-07-05 DIAGNOSIS — R350 Frequency of micturition: Secondary | ICD-10-CM | POA: Diagnosis not present

## 2017-07-05 DIAGNOSIS — Z8349 Family history of other endocrine, nutritional and metabolic diseases: Secondary | ICD-10-CM | POA: Diagnosis not present

## 2017-07-05 DIAGNOSIS — Z13220 Encounter for screening for lipoid disorders: Secondary | ICD-10-CM | POA: Diagnosis not present

## 2017-07-05 DIAGNOSIS — G43909 Migraine, unspecified, not intractable, without status migrainosus: Secondary | ICD-10-CM | POA: Diagnosis not present

## 2017-07-05 DIAGNOSIS — M791 Myalgia, unspecified site: Secondary | ICD-10-CM | POA: Diagnosis not present

## 2017-07-05 DIAGNOSIS — Z0000 Encounter for general adult medical examination without abnormal findings: Secondary | ICD-10-CM | POA: Diagnosis not present

## 2017-07-05 DIAGNOSIS — Z720 Tobacco use: Secondary | ICD-10-CM | POA: Diagnosis not present

## 2017-07-05 DIAGNOSIS — R5383 Other fatigue: Secondary | ICD-10-CM | POA: Diagnosis not present

## 2017-09-29 DIAGNOSIS — R87610 Atypical squamous cells of undetermined significance on cytologic smear of cervix (ASC-US): Secondary | ICD-10-CM | POA: Diagnosis not present

## 2017-09-29 DIAGNOSIS — R3915 Urgency of urination: Secondary | ICD-10-CM | POA: Diagnosis not present

## 2017-09-29 DIAGNOSIS — R61 Generalized hyperhidrosis: Secondary | ICD-10-CM | POA: Diagnosis not present

## 2017-09-29 DIAGNOSIS — Z01411 Encounter for gynecological examination (general) (routine) with abnormal findings: Secondary | ICD-10-CM | POA: Diagnosis not present

## 2017-09-29 DIAGNOSIS — Z3041 Encounter for surveillance of contraceptive pills: Secondary | ICD-10-CM | POA: Diagnosis not present

## 2018-02-13 ENCOUNTER — Ambulatory Visit
Admission: EM | Admit: 2018-02-13 | Discharge: 2018-02-13 | Disposition: A | Discharge status: Home / Self Care | Payer: 59 | Attending: Family Medicine | Admitting: Family Medicine

## 2018-02-13 ENCOUNTER — Other Ambulatory Visit: Payer: Self-pay

## 2018-02-13 DIAGNOSIS — J029 Acute pharyngitis, unspecified: Secondary | ICD-10-CM | POA: Insufficient documentation

## 2018-02-13 DIAGNOSIS — F17210 Nicotine dependence, cigarettes, uncomplicated: Secondary | ICD-10-CM | POA: Diagnosis not present

## 2018-02-13 LAB — RAPID STREP SCREEN (MED CTR MEBANE ONLY): STREPTOCOCCUS, GROUP A SCREEN (DIRECT): NEGATIVE

## 2018-02-13 NOTE — ED Triage Notes (Signed)
Patient complains of sore throat that started on Friday. Patient states that she has also felt run down but denies fevers.

## 2018-02-13 NOTE — Discharge Instructions (Addendum)
Strep negative.  Tylenol and ibuprofen as needed.  Take care  Dr. Adriana Simas

## 2018-02-13 NOTE — ED Provider Notes (Signed)
[facility]    CSN: [id number] Arrival date & time: 02/13/18  1829  History   Chief Complaint Chief Complaint  Patient presents with  . Sore Throat   HPI  43 year old female presents with sore throat.  Patient reports that she developed sore throat on Friday.  Mild in severity.  Associated fatigue.  Daughter also has a sore throat.  No fever.  She has been taking Motrin without resolution.  No known exacerbating factors.  No other reported symptoms.  No other complaints or concerns at this time.  PMH, Surgical Hx, Family Hx, Social History reviewed and updated as below.  Past Medical History:  Diagnosis Date  . Abnormal Pap smear   . 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 Depression    history - no meds  . Headache(784.0)   . HSV infection   . Migraine    Patient Active Problem List   Diagnosis Date Noted  . Chronic migraine 11/10/2016  . Pelvic pain in female 03/05/2012  . 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 Cervical intraepithelial neoplasia grade III with severe dysplasia 03/05/2012  . SVD (spontaneous vaginal delivery) 01/06/2011   Past Surgical History:  Procedure Laterality Date  . CERVICAL CONIZATION W/BX  03/30/2011   Procedure: CONIZATION CERVIX WITH BIOPSY;  Surgeon: Bing Plume, MD;  Location: [facility];  Service: Gynecology;  Laterality: N/A;  . CERVICAL CONIZATION W/BX  06/08/2011   Procedure: CONIZATION CERVIX WITH BIOPSY;  Surgeon: Bing Plume, MD;  Location: [facility];  Service: Gynecology;  Laterality: N/A;  conization/D & C  . DILATION AND CURETTAGE OF UTERUS  03/30/2011   Procedure: DILATATION AND CURETTAGE;  Surgeon: Bing Plume, MD;  Location: [facility];  Service: Gynecology;  Laterality: N/A;  . DILATION AND CURETTAGE OF UTERUS  06/08/2011   Procedure: DILATATION AND CURETTAGE;  Surgeon: Bing Plume, MD;  Location: [facility];  Service: Gynecology;  Laterality: N/A;  . SVD     x 5   OB History    Gravida  6   Para  5   Term   5   Preterm      AB  1   Living  5     SAB      TAB  1   Ectopic      Multiple      Live Births  1          Home Medications    Prior to Admission medications   Medication Sig Start Date End Date Taking? Authorizing Provider  NORETHINDRONE ACET-ETHINYL EST PO Take by mouth. Take as directed.   Yes [provider]    Family History Family History  Problem Relation Age of Onset  . Heart disease Mother   . Thyroid disease Mother   . Other Mother        benign pituitary tumors  . Hypertension Mother   . Bipolar disorder Father   . Hypertension Father   . Diabetes Brother   . Multiple sclerosis Other     Social History Social History   Tobacco Use  . Smoking status: Current Every Day Smoker    Packs/day: 0.25    Years: 21.00    Pack years: 5.25    Types: Cigarettes    Last attempt to quit: 09/18/2010    Years since quitting: 7.4  . Smokeless tobacco: Never Used  Substance Use Topics  . Alcohol use: Yes  Comment: Occasionally  . Drug use: No     Allergies   Latex   Review of Systems Review of Systems  Constitutional: Positive for fatigue. Negative for fever.  HENT: Positive for sore throat.    Physical Exam Triage Vital Signs ED Triage Vitals  Enc Vitals Group     BP 02/13/18 0939 (!) 134/93     Pulse Rate 02/13/18 0939 64     Resp 02/13/18 0939 16     Temp 02/13/18 0939 98.1 F (36.7 C)     Temp Source 02/13/18 0939 Oral     SpO2 02/13/18 0939 99 %     Weight 02/13/18 0937 160 lb (72.6 kg)     Height 02/13/18 0937 5\' 5"  (1.651 m)     Head Circumference --      Peak Flow --      Pain Score 02/13/18 0937 4     Pain Loc --      Pain Edu? --      Excl. in GC? --    Updated Vital Signs BP (!) 134/93 (BP Location: Left Arm)   Pulse 64   Temp 98.1 F (36.7 C) (Oral)   Resp 16   Ht 5\' 5"  (1.651 m)   Wt 72.6 kg   LMP 01/24/2018   SpO2 99%   BMI 26.63 kg/m    Visual Acuity Right Eye Distance:   Left Eye Distance:     Bilateral Distance:    Right Eye Near:   Left Eye Near:    Bilateral Near:     Physical Exam Vitals signs and nursing note reviewed.  Constitutional:      General: She is not in acute distress. HENT:     Head: Normocephalic and atraumatic.     Right Ear: Tympanic membrane normal.     Left Ear: Tympanic membrane normal.     Nose: Nose normal. No rhinorrhea.     Mouth/Throat:     Comments: Oropharynx with mild erythema. No exudate.  Eyes:     General:        Right eye: No discharge.        Left eye: No discharge.     Conjunctiva/sclera: Conjunctivae normal.  Cardiovascular:     Rate and Rhythm: Normal rate and regular rhythm.  Pulmonary:     Effort: Pulmonary effort is normal.     Breath sounds: Normal breath sounds. No wheezing, rhonchi or rales.  Neurological:     Mental Status: She is alert.  Psychiatric:        Mood and Affect: Mood normal.        Behavior: Behavior normal.    UC Treatments / Results  Labs (all labs ordered are listed, but only abnormal results are displayed) Labs Reviewed  RAPID STREP SCREEN ([facility] ONLY)  CULTURE, GROUP A STREP [facility])    EKG None  Radiology No results found.  Procedures Procedures (including critical care time)  Medications Ordered in UC Medications - No data to display  Initial Impression / Assessment and Plan / UC Course  I have reviewed the triage vital signs and the nursing notes.  Pertinent labs & imaging results that were available during my care of the patient were reviewed by me and considered in my medical decision making (see chart for details).    43 year old female presents with viral pharyngitis.  Tylenol and ibuprofen as needed.  Supportive care.  Final Clinical Impressions(s) / UC Diagnoses   Final diagnoses:  Viral pharyngitis     Discharge Instructions     Strep negative.  Tylenol and ibuprofen as needed.  Take care  Dr. Adriana Simas    ED Prescriptions    None     Controlled  Substance Prescriptions [place] Controlled Substance Registry consulted? Not Applicable  Tommie Sams, DO 02/13/18 1024

## 2018-02-16 LAB — CULTURE, GROUP A STREP (THRC)

## 2018-03-23 ENCOUNTER — Ambulatory Visit: Payer: Self-pay | Admitting: Physician Assistant

## 2018-03-23 VITALS — BP 120/80 | HR 86 | Temp 98.1°F | Resp 16 | Wt 167.0 lb

## 2018-03-23 DIAGNOSIS — K449 Diaphragmatic hernia without obstruction or gangrene: Secondary | ICD-10-CM

## 2018-03-23 DIAGNOSIS — R0982 Postnasal drip: Secondary | ICD-10-CM

## 2018-03-23 DIAGNOSIS — J029 Acute pharyngitis, unspecified: Secondary | ICD-10-CM

## 2018-03-23 DIAGNOSIS — K219 Gastro-esophageal reflux disease without esophagitis: Secondary | ICD-10-CM

## 2018-03-23 LAB — POCT INFLUENZA A/B
Influenza A, POC: NEGATIVE
Influenza B, POC: NEGATIVE

## 2018-03-23 LAB — POCT RAPID STREP A (OFFICE): RAPID STREP A SCREEN: NEGATIVE

## 2018-03-23 MED ORDER — FLUTICASONE PROPIONATE 50 MCG/ACT NA SUSP: 2.0000 | Freq: Every day | NASAL | 0 refills | Status: DC

## 2018-03-23 MED ORDER — LORATADINE-PSEUDOEPHEDRINE ER 5-120 MG PO TB12: 1.0000 | ORAL_TABLET | Freq: Two times a day (BID) | ORAL | 0 refills | Status: AC

## 2018-03-23 MED ORDER — MAGIC MOUTHWASH W/LIDOCAINE: 5.0000 mL | Freq: Four times a day (QID) | ORAL | 0 refills | Status: DC | PRN

## 2018-03-23 MED ORDER — FAMOTIDINE 20 MG PO TABS: 20.0000 mg | ORAL_TABLET | Freq: Two times a day (BID) | ORAL | 0 refills | Status: DC

## 2018-03-23 NOTE — Addendum Note (Signed)
Addended by: Yvonne Kendall D on: 03/23/2018 03:53 PM   Modules accepted: Orders

## 2018-03-23 NOTE — Progress Notes (Addendum)
Patient ID: [name] DOB: [birth year] AGE: 43 y.o. MRN: [id number]   PCP: Patient, No Pcp Per   Chief Complaint:  Chief Complaint  Patient presents with  . Sore Throat    x2d  . Fatigue    x2d  . Chills    x2d     Subjective:    HPI:  [name] is a 43 y.o. female presents for evaluation  Chief Complaint  Patient presents with  . Sore Throat    x2d  . Fatigue    x2d  . Chills    x57d    43 year old female presents to [facility] with three day history of sore throat. Began midday Wednesday 03/21/2018. Severe. Describes as burning sensation. Right side worse than left. Aggravated with swallowing. Associated right ear pain. Hurts with swallowing; popping/crackling and pressure. Associated nasal congestion, fatigue, and malaise. Has taken OTC ibuprofen and Tylenol with no relief. Denies fever via thermometer (took temperature, was 2F), headache, ear discharge/drainage, sinus pain, cough, chest pain, SOB, wheezing, nausea/vomiting, diarrhea. Patient denies difficulty swallowing (able to eat and drink), drooling, dyspnea. Patient's daughter with similar symptoms last week, seen by pediatrician, negative rapid strep test, diagnosed with otitis media, prescribed Augmentin. Patient works in health care, multiple flu exposures. Patient did receive this season's influenza vaccination.  Patient seen at [place] urgent care on 02/13/2018, one month ago, with sore throat. Negative rapid strep test. Diagnosed with viral pharyngitis. No medications prescribed. Throat culture reported on 02/16/2018, no group A strep isolated.  Patient states for past few months, sore throat and ear pain are on and off. Denies seasonal allergies. Patient with known hiatal hernia. Mild GERD symptoms intermittently; cannot tolerate carbonated beverages or tomato based foods well. No previous history of EGD. Has not been evaluated by gastroenterologist. Does not take H2  blocker or PPI regularly. Patient is a cigarette smoker; currently averages 5 cigarettes per day.  Patient with medical history of migraines (not currently on any prescription medication) and abnormal pap smears (followed by [facility] gynecological [facility]ology).  A limited review of symptoms was performed, pertinent positives and negatives as mentioned in HPI.  The following portions of the patient's history were reviewed and updated as appropriate: allergies, current medications and past medical history.  Patient Active Problem List   Diagnosis Date Noted  . Chronic migraine 11/10/2016  . Pelvic pain in female 03/05/2012  . 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 Cervical intraepithelial neoplasia grade III with severe dysplasia 03/05/2012  . SVD (spontaneous vaginal delivery) 01/06/2011    Allergies  Allergen Reactions  . Latex Itching and Other (See Comments)    Reaction: yeast infection    Current Outpatient Medications on File Prior to Visit  Medication Sig Dispense Refill  . NORETHINDRONE ACET-ETHINYL EST PO Take by mouth. Take as directed.    Marland Kitchen ipratropium (ATROVENT) 0.06 % nasal spray USE 2 SPRAY(S) IN EACH NOSTRIL 3 TO 4 TIMES DAILY AS NEEDED FOR ALLERGIES     No current facility-administered medications on file prior to visit.        Objective:   Vitals:   03/23/18 0814  BP: 124/82  Pulse: 72  Resp: 16  Temp: 98.2 F (36.8 C)  SpO2: 98%     Wt Readings from Last 3 Encounters:  03/23/18 166 lb (75.3 kg)  02/13/18 160 lb (72.6 kg)  11/10/16 157 lb (71.2 kg)    Physical Exam:  General Appearance:  Patient sitting comfortably on examination table. Conversational. Kermit Balo self-historian. In no acute distress. Afebrile.   Head:  Normocephalic, without obvious abnormality, atraumatic  Eyes:  PERRL, conjunctiva/corneas clear, EOM's intact  Ears:  Left ear canal WNL. No erythema or edema. No open wound. No visible purulent drainage. No  tenderness with palpation over left tragus or with manipulation of left auricle. No visible erythema or edema of left mastoid. No tenderness with palpation over left mastoid. Right ear canal WNL. No erythema or edema. No open wound. No visible purulent drainage. No tenderness with palpation over right tragus or with manipulation of right auricle. No visible erythema or edema of right mastoid. No tenderness with palpation over right mastoid. Left TM WNL. Good light reflex. Visible landmarks. No erythema. No injection. No bulging or retraction. No visible perforation. No serous effusion. No visible purulent effusion. No tympanostomy tube. No scar tissue. Right TM WNL. Good light reflex. Visible landmarks. No erythema. No injection. No bulging or retraction. No visible perforation. No serous effusion. No visible purulent effusion. No tympanostomy tube. No scar tissue.  Nose: Nares normal. Septum midline. No visible polyps. Nasal mucosa with bilateral edema and scant clear rhinorrhea. No sinus tenderness with percussion/palpation.  Throat: Lips, mucosa, and tongue normal; teeth and gums normal. Throat reveals no erythema. No postnasal drip. No visible cobblestoning. Tonsils with no erythema. Minimal injection. No aphthous ulcers. No exudate. No tonsillar enlargement. No hot potato/muffled voice. No stridor. No trismus. No drooling. Uvula midline with no edema or erythema.  Neck: Supple, symmetrical, trachea midline, no palpable lymphadenopathy  Lungs:   Clear to auscultation bilaterally, respirations unlabored. Good aeration. No rales, rhonchi, crackles or wheezing.  Heart:  Regular rate and rhythm, S1 and S2 normal, no murmur, rub, or gallop  Abdomen Normal to inspection. Normoactive bowel sounds. No tenderness with palpation; including in epigastric area. No guarding, rebound, or rigidity. No palpable organomegaly.  Extremities: Extremities normal, atraumatic, no cyanosis or edema  Pulses: 2+ and symmetric   Skin: Skin color, texture, turgor normal, no rashes or lesions  Lymph nodes: Cervical, supraclavicular, and axillary nodes normal  Neurologic: Normal    Assessment & Plan:    Exam findings, diagnosis etiology and medication use and indications reviewed with patient. Follow-Up and discharge instructions provided. No emergent/urgent issues found on exam.  Patient education was provided.   Patient verbalized understanding of information provided and agrees with plan of care (POC), all questions answered. The patient is advised to call or return to clinic if condition does not see an improvement in symptoms, or to seek the care of the closest emergency department if condition worsens with the below plan.    1. Sore throat - POCT rapid strep A - loratadine-pseudoephedrine (CLARITIN-D 12 HOUR) 5-120 MG tablet; Take 1 tablet by mouth 2 (two) times daily for 14 days.  Dispense: 28 tablet; Refill: 0 - fluticasone (FLONASE) 50 MCG/ACT nasal spray; Place 2 sprays into both nostrils daily.  Dispense: 16 g; Refill: 0 - magic mouthwash w/lidocaine SOLN; Take 5 mLs by mouth 4 (four) times daily as needed for mouth pain.  Dispense: 120 mL; Refill: 0  2. PND (post-nasal drip)  3. Hiatal hernia with GERD - famotidine (PEPCID) 20 MG tablet; Take 1 tablet (20 mg total) by mouth 2 (two) times daily for 14 days.  Dispense: 28 tablet; Refill: 0   Patient with 3 day history of sore throat, nasal congestion, and right ear pain. Negative rapid strep test. Right  TM normal on physical exam. Patient with several month history of on&off sore throat (previous negative rapid strep test and throat culture). Believe patient with sore throat secondary to PND and GERD. Prescribed Claritin-D, Flonase, Magic Mouthwash, and Famotidine. Patient's VSS, afebrile, in no acute distress, no indication of tonsillar abscess, retropharyngeal abscess, Ludwig's angina. Advised patient follow-up with PCP or urgent care in 4-5 days if not  improving, sooner with any worsening symptoms. Patient may also wish to see ENT and/or gastroenterologist. Patient agrees with plan.   Addendum: Patient called office at 3pm. Stated she continued to have chills. Wanted to be tested for influenza. Told patient she could return to [facility] clinic, we could perform rapid flu test and simple addend her note.  Patient presented at 3:45pm. Vital signs repeated, still stable (no fever). Patient does state within the past few hours she took 862m of ibuprofen and 1019mof Tylenol; could be controlling fever. Rapid flu test performed. Negative. Patient advised to continue plan as discussed this morning. Patient given work note for the next two days. Patient concerned about bodyaches. States she could go to the urgent care or ED for further evaluation; they may perform blood work or imaging. Recommended follow-up with PCP next week if continued symptoms; could have extensive work-up, including EBV titer, thyroid function tested, ESR/CRP, Lyme test, or others. Patient agreed with plan. No new medication prescribed. No AVS printed. No additional charges.   SaDarlin PriestlyMHS, PA-C SaMontey HoraMHS, PA-C Advanced Practice Provider CoDrexel Center For Digestive Health[address] RoadGrUniversity Health System, St. Francis Campus1sNapoleonNC [id number]):  [email or web address]

## 2018-03-23 NOTE — Patient Instructions (Addendum)
Thank you for choosing [facility] for your health care needs.  You have been diagnosed with pharyngitis (a sore throat).  Your rapid strep test was negative.  Take medications as prescribed: Meds ordered this encounter  Medications  . loratadine-pseudoephedrine (CLARITIN-D 12 HOUR) 5-120 MG tablet    Sig: Take 1 tablet by mouth 2 (two) times daily for 14 days.    Dispense:  28 tablet    Refill:  0    Order Specific Question:   Supervising Provider    Answer:   MILLER, BRIAN [[id number]]  . fluticasone (FLONASE) 50 MCG/ACT nasal spray    Sig: Place 2 sprays into both nostrils daily.    Dispense:  16 g    Refill:  0    Order Specific Question:   Supervising Provider    Answer:   MILLER, BRIAN [[id number]]  . magic mouthwash w/lidocaine SOLN    Sig: Take 5 mLs by mouth 4 (four) times daily as needed for mouth pain.    Dispense:  120 mL    Refill:  0    Order Specific Question:   Supervising Provider    Answer:   Eber Hong [[id number]]   Stop using Flonase if you develop blood in nasal discharge. May then use saline nasal spray or OTC Afrin nasal spray (for only 3 days).  Recommend increase fluids; water, Gatorade, hot tea with lemon/honey. Rest.  For sore throat relief, may gargle with salt water. May use analgesic throat spray or throat lozenges such as Cepacol or Chloraseptic.  Take over the counter Tylenol or ibuprofen for pain relief.  Follow-up with family physician or urgent care in 4-5 days if symptoms not improving. Sooner with any worsening symptoms.  If sore throat persists, may wish to seen ENT (ear, nose and throat) specialist.  Hope you feel better soon!  Pharyngitis  Pharyngitis is a sore throat (pharynx). This is when there is redness, pain, and swelling in your throat. Most of the time, this condition gets better on its own. In some cases, you may need medicine. Follow these instructions at home:  Take over-the-counter and prescription medicines only as told by your  doctor. ? If you were prescribed an antibiotic medicine, take it as told by your doctor. Do not stop taking the antibiotic even if you start to feel better. ? Do not give children aspirin. Aspirin has been linked to Reye syndrome.  Drink enough water and fluids to keep your pee (urine) clear or pale yellow.  Get a lot of rest.  Rinse your mouth (gargle) with a salt-water mixture 3-4 times a day or as needed. To make a salt-water mixture, completely dissolve -1 tsp of salt in 1 cup of warm water.  If your doctor approves, you may use throat lozenges or sprays to soothe your throat. Contact a doctor if:  You have large, tender lumps in your neck.  You have a rash.  You cough up green, yellow-brown, or bloody spit. Get help right away if:  You have a stiff neck.  You drool or cannot swallow liquids.  You cannot drink or take medicines without throwing up.  You have very bad pain that does not go away with medicine.  You have problems breathing, and it is not from a stuffy nose.  You have new pain and swelling in your knees, ankles, wrists, or elbows. Summary  Pharyngitis is a sore throat (pharynx). This is when there is redness, pain, and swelling in your throat.  If  you were prescribed an antibiotic medicine, take it as told by your doctor. Do not stop taking the antibiotic even if you start to feel better.  Most of the time, pharyngitis gets better on its own. Sometimes, you may need medicine. This information is not intended to replace advice given to you by your health care provider. Make sure you discuss any questions you have with your health care provider. Document Released: 06/22/2007 Document Revised: 02/09/2016 Document Reviewed: 02/09/2016 Elsevier Interactive Patient Education  2019 [employer].

## 2018-03-27 ENCOUNTER — Telehealth: Payer: Self-pay | Admitting: Emergency Medicine

## 2018-03-27 NOTE — Telephone Encounter (Signed)
Recommend patient call family physician's office. See if she can be seen by her family physician sometime this week for further evaluation and treatment.  Thanks SFS PA-C

## 2018-03-27 NOTE — Telephone Encounter (Signed)
Patient contacted office to inform us that she is no better. Medications prescribed has not help. Her symptoms are sorethroat,drainge,mucus(grayish),cough,hoarseness,ear pain now moved to her left ear. Patient uses [facility]([address])  Please advise

## 2018-03-28 DIAGNOSIS — H6690 Otitis media, unspecified, unspecified ear: Secondary | ICD-10-CM | POA: Diagnosis not present

## 2018-03-28 DIAGNOSIS — J069 Acute upper respiratory infection, unspecified: Secondary | ICD-10-CM | POA: Diagnosis not present

## 2018-04-17 DIAGNOSIS — H6590 Unspecified nonsuppurative otitis media, unspecified ear: Secondary | ICD-10-CM | POA: Diagnosis not present

## 2018-08-14 DIAGNOSIS — Z0000 Encounter for general adult medical examination without abnormal findings: Secondary | ICD-10-CM | POA: Diagnosis not present

## 2018-08-14 DIAGNOSIS — Z8349 Family history of other endocrine, nutritional and metabolic diseases: Secondary | ICD-10-CM | POA: Diagnosis not present

## 2018-08-14 DIAGNOSIS — E559 Vitamin D deficiency, unspecified: Secondary | ICD-10-CM | POA: Diagnosis not present

## 2018-08-14 DIAGNOSIS — F17200 Nicotine dependence, unspecified, uncomplicated: Secondary | ICD-10-CM | POA: Diagnosis not present

## 2018-08-14 DIAGNOSIS — G43909 Migraine, unspecified, not intractable, without status migrainosus: Secondary | ICD-10-CM | POA: Diagnosis not present

## 2018-08-14 DIAGNOSIS — Z720 Tobacco use: Secondary | ICD-10-CM | POA: Diagnosis not present

## 2018-08-21 DIAGNOSIS — Z13220 Encounter for screening for lipoid disorders: Secondary | ICD-10-CM | POA: Diagnosis not present

## 2018-08-21 DIAGNOSIS — E559 Vitamin D deficiency, unspecified: Secondary | ICD-10-CM | POA: Diagnosis not present

## 2018-08-21 DIAGNOSIS — Z8349 Family history of other endocrine, nutritional and metabolic diseases: Secondary | ICD-10-CM | POA: Diagnosis not present

## 2018-08-21 DIAGNOSIS — Z0000 Encounter for general adult medical examination without abnormal findings: Secondary | ICD-10-CM | POA: Diagnosis not present

## 2018-09-12 DIAGNOSIS — Z1231 Encounter for screening mammogram for malignant neoplasm of breast: Secondary | ICD-10-CM | POA: Diagnosis not present

## 2018-10-02 DIAGNOSIS — Z720 Tobacco use: Secondary | ICD-10-CM | POA: Diagnosis not present

## 2018-10-02 DIAGNOSIS — Z113 Encounter for screening for infections with a predominantly sexual mode of transmission: Secondary | ICD-10-CM | POA: Diagnosis not present

## 2018-10-02 DIAGNOSIS — Z3041 Encounter for surveillance of contraceptive pills: Secondary | ICD-10-CM | POA: Diagnosis not present

## 2018-10-02 DIAGNOSIS — Z01419 Encounter for gynecological examination (general) (routine) without abnormal findings: Secondary | ICD-10-CM | POA: Diagnosis not present

## 2018-10-02 DIAGNOSIS — Z3009 Encounter for other general counseling and advice on contraception: Secondary | ICD-10-CM | POA: Diagnosis not present

## 2018-10-02 DIAGNOSIS — G43909 Migraine, unspecified, not intractable, without status migrainosus: Secondary | ICD-10-CM | POA: Diagnosis not present

## 2018-10-05 DIAGNOSIS — Z3202 Encounter for pregnancy test, result negative: Secondary | ICD-10-CM | POA: Diagnosis not present

## 2018-10-05 DIAGNOSIS — Z30430 Encounter for insertion of intrauterine contraceptive device: Secondary | ICD-10-CM | POA: Diagnosis not present

## 2018-11-05 DIAGNOSIS — Z03818 Encounter for observation for suspected exposure to other biological agents ruled out: Secondary | ICD-10-CM | POA: Diagnosis not present

## 2018-11-16 DIAGNOSIS — N939 Abnormal uterine and vaginal bleeding, unspecified: Secondary | ICD-10-CM | POA: Diagnosis not present

## 2018-11-16 DIAGNOSIS — Z30431 Encounter for routine checking of intrauterine contraceptive device: Secondary | ICD-10-CM | POA: Diagnosis not present

## 2019-05-20 NOTE — Progress Notes (Signed)
Patient ID: [name], female    DOB: 1975-12-25, 44 y.o.   MRN: [id number]  PCP: Towanda Malkin, MD  Chief Complaint  Patient presents with  . New Patient (Initial Visit)  . Establish Care    Subjective:   [name] is a 44 y.o. female, presents to clinic with CC of the following:  Chief Complaint  Patient presents with  . New Patient (Initial Visit)  . Establish Care    HPI:  Patient is a 44 y.o female who presents today new to the practice.  She had been followed in the [facility] system with her last general medical evaluation in 08/21/2018 (Dr. Laurann Montana, [place]) She has been followed most recently by plastics after a breast augmentation procedure in late 2020, with no recent concerns on follow-up visits.  Her main complaint today is feeling tired all the time, low energy levels at times.  She notes this started after her last child was born, who is now approximately 42 years old.  She did not ever lose weight from that pregnancy as she desired, and struggled to try to lose weight in the postpartum period despite being very active. She states she is sleeping okay, gets to sleep quickly, tries to get to bed by 10:00 at night.  She is up 3 times a week at approximately 4 or 430, only getting 6+ hours on most nights, tries to get a little more on other days.  She notes she does snore, again noted it was after her last child was born. 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  She also is quite busy with care of her children. She states she has had lab work done with her prior evaluations, with no obvious findings to help in the source of the chronic fatigue.  She noted some concerns with her family history and endocrine issues including thyroid disease, and mom had a pituitary tumor with a knife surgery done to manage, also mom had a brain tumor in her  past, and she does have a uncle with diabetes.  She has no personal history of diabetes or thyroid disease. She notes she takes a lot of vitamins, although when was recommended to take high-dose of a vitamin D in the past, she did not do so, and noted she preferred to just try and drink more milk. Past medical issues recently noted following have included:  Vitamin D deficiency  As above noted, did not have replacement therapy when diagnosed Tobacco use Quit tobacco use September/2020 and has not returned.   Family history of other endocrine, nutritional and metabolic diseases  As above noted   Migraine, without status migrainosus, she notes her headaches have been a little more frequent in the past couple weeks, she thinks may be related to the allergens, and has been taking Allegra-D daily to help.  She notes she does not tolerate the nasal sprays very well, as I noted Flonase as an option.  GERD -she was on Pepcid, and recently changed to a Nexium product, and notes that Nexium once a day is working better than the Pepcid twice a day.  It controls her reflux throughout the day.  Denies any dark/black stools, no abdominal pains chronically, no bleeding per rectum.  H/o abnormal PAP: Last PAP was 09/29/2018 and had normal cytology and HPV neg  Gyn history prior:  Is gravida 6 para 22 November 2002 negative Pap smear  May 2006 low-grade dysplasia HPV negative.  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 D&C, Benign with inflamed endocervix endometrial biopsy was benign  2/14 normal pap smear  09/2014 - normal PAP  Last mammogram - 09/12/2018 (prior to breast surgery) Last visit with OB/Gyn 11/16/2018 - IUD was inserted 09/2018 - Mirena  On a prior visit with gyn, it was  noted that "She is asking for more holistic provider that she does not want to take medications necessarily for minor ailments."  She did echo today that she is careful with taking medications when possible.  Tob - quit smoking 10/14/2018 (prior to breast surgery) Alcohol-occasional  Patient Active Problem List   Diagnosis Date Noted  . Chronic migraine 11/10/2016  . Pelvic pain in female 03/05/2012  . 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 Cervical intraepithelial neoplasia grade III with severe dysplasia 03/05/2012  . SVD (spontaneous vaginal delivery) 01/06/2011      Current Outpatient Medications:  .  esomeprazole (NEXIUM) 20 MG capsule, Take 20 mg by mouth daily at 12 noon., Disp: , Rfl:  .  fexofenadine-pseudoephedrine (ALLEGRA-D 24) 180-240 MG 24 hr tablet, Take 1 tablet by mouth daily. As needed, Disp: , Rfl:  .  levonorgestrel (MIRENA) 20 MCG/24HR IUD, 1 each by Intrauterine route once., Disp: , Rfl:    Allergies  Allergen Reactions  . Latex Itching and Other (See Comments)    Reaction: yeast infection     Past Surgical History:  Procedure Laterality Date  . CERVICAL CONIZATION W/BX  03/30/2011   Procedure: CONIZATION CERVIX WITH BIOPSY;  Surgeon: Bing Plume, MD;  Location: [facility];  Service: Gynecology;  Laterality: N/A;  . CERVICAL CONIZATION W/BX  06/08/2011   Procedure: CONIZATION CERVIX WITH BIOPSY;  Surgeon: Bing Plume, MD;  Location: [facility];  Service: Gynecology;  Laterality: N/A;  conization/D & C  . DILATION AND CURETTAGE OF UTERUS  03/30/2011   Procedure: DILATATION AND CURETTAGE;  Surgeon: Bing Plume, MD;  Location: [facility];  Service: Gynecology;  Laterality: N/A;  . DILATION AND CURETTAGE OF UTERUS  06/08/2011   Procedure: DILATATION AND CURETTAGE;  Surgeon: Bing Plume, MD;  Location: [facility];  Service: Gynecology;  Laterality: N/A;  . SVD     x 5     Family History  Problem Relation Age of Onset  . Heart disease Mother   .  Thyroid disease Mother   . Other Mother        benign pituitary tumors  . Hypertension Mother   . Bipolar disorder Father   . Hypertension Father   . Diabetes Brother   . Multiple sclerosis Other      Social History   Tobacco Use  . Smoking status: Former Smoker    Packs/day: 0.25    Years: 21.00    Pack years: 5.25    Types: Cigarettes    Quit date: 09/18/2018    Years since quitting: 0.6  . Smokeless tobacco: Never Used  Substance Use Topics  . Alcohol use: Yes    Comment: Occasionally    With staff assistance, above reviewed with the patient today.  ROS:ROS: As noted above in HPI  No  recent fevers or other Covid concerning sx's, she did not get the vaccine. Mild increase headaches, often start behind the right eye, and sometimes progress to feeling pains down her neck and shoulder regions as well.  Positive vision changes noted in the past year, and I did recommend returning to see an eye doctor. No CP, palpitations, increased lower extremity swelling, denied any concerning heart history No increased SOB, no asthma history No persistent abdominal pains  No rectal bleeding or dark/black stools,  No recent flank pains, dysuria, or frequency No increase joint aches or swelling,   No numbness, tingling or weakness in the extremities, no balance difficulties, no tremors Denies other specific complaints on general systems review except as noted in HPI  No results found for this or any previous visit (from the past 72 hour(s)).   PHQ2/9: Depression screen PHQ 2/9 05/21/2019  Decreased Interest 0  Down, Depressed, Hopeless 0  PHQ - 2 Score 0  Altered sleeping 0  Tired, decreased energy 2  Change in appetite 0  Feeling bad or failure about yourself  0  Trouble concentrating 0  Moving slowly or fidgety/restless 0  Suicidal thoughts 0  PHQ-9 Score 2  Difficult doing work/chores Not difficult at all   PHQ-2/9 Result is neg  Fall Risk: Fall Risk  05/21/2019  Falls in  the past year? 0  Number falls in past yr: 0  Injury with Fall? 0      Objective:   Vitals:   05/21/19 0827  Pulse: 98  Resp: 16  Temp: 98.1 F (36.7 C)  TempSrc: Temporal  SpO2: 98%  Weight: 168 lb 8 oz (76.4 kg)  Height: 5\' 4"  (1.626 m)    Body mass index is 28.92 kg/m.  Physical Exam   NAD, masked, pleasant HEENT - [place]/AT, sclera anicteric, PERRL, EOMI, conj - non-inj'ed, TM's and canals clear, pharynx clear Neck - supple, no adenopathy, no TM, carotids 2+ and = without bruits bilat Car - RRR without m/g/r Pulm- RR and effort normal at rest, CTA without wheeze or rales Abd - soft, NT, ND, BS+,  no masses, no HSM Back - no CVA tenderness Skin- no rash noted on exposed areas,  Breast and pelvic exams deferred as continues to see gynecology on a yearly basis Ext - no LE edema, no active joints Neuro/psychiatric - affect was not flat, appropriate with conversation  Alert and oriented  Grossly non-focal - good strength on testing extremities, sensation intact to LT in distal extremities, DTRs 2+ and equal in the patella, Romberg was negative, no pronator drift, good finger-to-nose, no resting or action tremor, gait was normal with good tandem walk  Speech normal      Assessment & Plan:   1. Encounter to establish care with new doctor  2. Encounter for hepatitis C screening test for low risk patient She agreed to have the screening today. - Hepatitis C antibody  3. Chronic fatigue Discussed the many possibilities that can contribute to fatigue, and with her family history of endocrine issues, especially mom, did feel checking some lab test was needed. Discussed potential sleep apnea and a sleep study to help assess, as she does note she is a snorer as well.  Is not obese.  Will await results of these lab tests initially and monitor. Also discussed trying to do some kind of regular exercise, as I do feel that can be helpful in trying to break the cycle of fatigue. Await  lab results presently. -  Thyroid Panel With TSH - Iron, TIBC and Ferritin Panel - CBC with Differential/Platelet - COMPLETE METABOLIC PANEL WITH GFR  4. Vitamin D deficiency Noted that vitamin D deficiency at times can be linked to fatigue, although hard to be noted as the sole source.  We will check a vitamin D level today. - VITAMIN D 25 Hydroxy (Vit-D Deficiency, Fractures)  5. Chronic migraine Headaches have slightly increased in the past couple weeks, although may be related to increase allergy symptoms. Discussed aborting and preventative approaches to headaches, and currently she uses ibuprofen for the abortive approach.  Seems to be helpful as she noted she did not tolerate other medications to help in the past including Topamax. Also noted a little early to add an every day medicine for prevention, although did review them with her briefly today including the ones that are an injection once a month.  6. Screening for lipid disorders We will check a lipid panel today. - Lipid panel  7. Family history of endocrine disorder As above noted, await some initial lab tests ordered. - Thyroid Panel With TSH  8. Overweight (BMI 25.0-29.9) She notes has been an issue after the birth of her last child, and has struggled to try to lose weight since.  She has been recommended dietary modifications and exercise increases in the past.  Do feel they are important to help, and and await lab results as low thyroid often can contribute to difficulty losing weight.  9.  Screening for HIV She notes her gynecologist has checked her HIV on a routine basis, and did not feel it was needed to be checked again today.  Feel that is reasonable.  10.  Seasonal allergic rhinitis Takes the Allegra-D product daily I did discuss at length some concerns with chronic Sudafed use over time.  Discussed the Flonase product which can be helpful, although she noted she does not do well with the nasal sprays.  We will  continue the Allegra-D in the short-term, as she often does not need it for prolonged periods of time with her symptoms mostly with change of seasons.   11.  GERD Symptoms have improved changing to Nexium from Pepcid.  Okay to continue the Nexium daily, and did discuss today concerns with long-term use of PPIs over time.  Also noted the importance of reflux precautions and reviewed that with her today.  Over time, discussed potentially trying off of the Nexium again, although will not rush to that presently. She noted that certain types of bottled water cause reflux and others do not, I could not explain that physiologically, just recommended trying to use the ones that do not cause her reflux.  Await lab results presently, she asked that I send prescriptions for the Nexium and her Allegra-D for insurance purposes to her pharmacy, and I stated I would. A tentative follow-up was scheduled for 6 months time, follow-up sooner as needed. Also continuing with her annual gynecology follow-ups are important and she plans to as she noted she really wants to continue to follow with Dr. Charlotta Newton.     Jamelle Haring, MD 05/21/19 8:43 AM

## 2019-05-21 ENCOUNTER — Encounter: Payer: Self-pay | Admitting: Internal Medicine

## 2019-05-21 ENCOUNTER — Ambulatory Visit (INDEPENDENT_AMBULATORY_CARE_PROVIDER_SITE_OTHER): Payer: 59 | Admitting: Internal Medicine

## 2019-05-21 ENCOUNTER — Other Ambulatory Visit: Payer: Self-pay

## 2019-05-21 VITALS — BP 116/80 | HR 98 | Temp 98.1°F | Resp 16 | Ht 64.0 in | Wt 168.5 lb

## 2019-05-21 DIAGNOSIS — R5382 Chronic fatigue, unspecified: Secondary | ICD-10-CM | POA: Insufficient documentation

## 2019-05-21 DIAGNOSIS — Z7689 Persons encountering health services in other specified circumstances: Secondary | ICD-10-CM

## 2019-05-21 DIAGNOSIS — E663 Overweight: Secondary | ICD-10-CM | POA: Diagnosis not present

## 2019-05-21 DIAGNOSIS — Z1159 Encounter for screening for other viral diseases: Secondary | ICD-10-CM

## 2019-05-21 DIAGNOSIS — Z13220 Encounter for screening for lipoid disorders: Secondary | ICD-10-CM | POA: Diagnosis not present

## 2019-05-21 DIAGNOSIS — G43709 Chronic migraine without aura, not intractable, without status migrainosus: Secondary | ICD-10-CM | POA: Diagnosis not present

## 2019-05-21 DIAGNOSIS — Z114 Encounter for screening for human immunodeficiency virus [HIV]: Secondary | ICD-10-CM | POA: Diagnosis not present

## 2019-05-21 DIAGNOSIS — E559 Vitamin D deficiency, unspecified: Secondary | ICD-10-CM

## 2019-05-21 DIAGNOSIS — Z8349 Family history of other endocrine, nutritional and metabolic diseases: Secondary | ICD-10-CM | POA: Insufficient documentation

## 2019-05-21 DIAGNOSIS — J301 Allergic rhinitis due to pollen: Secondary | ICD-10-CM | POA: Insufficient documentation

## 2019-05-21 DIAGNOSIS — K219 Gastro-esophageal reflux disease without esophagitis: Secondary | ICD-10-CM | POA: Insufficient documentation

## 2019-05-21 DIAGNOSIS — IMO0002 Reserved for concepts with insufficient information to code with codable children: Secondary | ICD-10-CM

## 2019-05-21 MED ORDER — FEXOFENADINE-PSEUDOEPHED ER 180-240 MG PO TB24: 1.0000 | ORAL_TABLET | Freq: Every day | ORAL | 3 refills | Status: AC

## 2019-05-21 MED ORDER — ESOMEPRAZOLE MAGNESIUM 20 MG PO CPDR: 20.0000 mg | DELAYED_RELEASE_CAPSULE | Freq: Every day | ORAL | 1 refills | Status: AC

## 2019-05-22 ENCOUNTER — Encounter: Payer: Self-pay | Admitting: Internal Medicine

## 2019-05-22 LAB — LIPID PANEL
Cholesterol: 163 mg/dL (ref <200)
HDL: 43 mg/dL — ABNORMAL LOW (ref >=50)
LDL Cholesterol (Calc): 98 mg/dL (calc)
Non-HDL Cholesterol (Calc): 120 mg/dL (calc) (ref <130)
Total CHOL/HDL Ratio: 3.8 (calc) (ref <5.0)
Triglycerides: 121 mg/dL (ref <150)

## 2019-05-22 LAB — CBC WITH DIFFERENTIAL/PLATELET
Absolute Monocytes: 441 cells/uL (ref 200–950)
Basophils Absolute: 38 cells/uL (ref 0–200)
Basophils Relative: 0.5 %
Eosinophils Absolute: 122 cells/uL (ref 15–500)
Eosinophils Relative: 1.6 %
HCT: 39.7 % (ref 35.0–45.0)
Hemoglobin: 13.1 g/dL (ref 11.7–15.5)
Lymphs Abs: 3359 cells/uL (ref 850–3900)
MCH: 31.3 pg (ref 27.0–33.0)
MCHC: 33 g/dL (ref 32.0–36.0)
MCV: 95 fL (ref 80.0–100.0)
MPV: 9.8 fL (ref 7.5–12.5)
Monocytes Relative: 5.8 %
Neutro Abs: 3640 cells/uL (ref 1500–7800)
Neutrophils Relative %: 47.9 %
Platelets: 296 10*3/uL (ref 140–400)
RBC: 4.18 10*6/uL (ref 3.80–5.10)
RDW: 12.9 % (ref 11.0–15.0)
Total Lymphocyte: 44.2 %
WBC: 7.6 10*3/uL (ref 3.8–10.8)

## 2019-05-22 LAB — COMPLETE METABOLIC PANEL WITH GFR
AG Ratio: 1.8 (calc) (ref 1.0–2.5)
ALT: 17 U/L (ref 6–29)
AST: 15 U/L (ref 10–30)
Albumin: 4.5 g/dL (ref 3.6–5.1)
Alkaline phosphatase (APISO): 58 U/L (ref 31–125)
BUN: 8 mg/dL (ref 7–25)
CO2: 27 mmol/L (ref 20–32)
Calcium: 9.2 mg/dL (ref 8.6–10.2)
Chloride: 103 mmol/L (ref 98–110)
Creat: 0.75 mg/dL (ref 0.50–1.10)
GFR, Est African American: 115 mL/min/{1.73_m2} (ref >=60)
GFR, Est Non African American: 99 mL/min/{1.73_m2} (ref >=60)
Globulin: 2.5 g/dL (calc) (ref 1.9–3.7)
Glucose, Bld: 84 mg/dL (ref 65–139)
Potassium: 4 mmol/L (ref 3.5–5.3)
Sodium: 138 mmol/L (ref 135–146)
Total Bilirubin: 0.3 mg/dL (ref 0.2–1.2)
Total Protein: 7 g/dL (ref 6.1–8.1)

## 2019-05-22 LAB — THYROID PANEL WITH TSH
Free Thyroxine Index: 2.2 (ref 1.4–3.8)
T3 Uptake: 32 % (ref 22–35)
T4, Total: 7 ug/dL (ref 5.1–11.9)
TSH: 1.25 mIU/L

## 2019-05-22 LAB — HEPATITIS C ANTIBODY
Hepatitis C Ab: NONREACTIVE
SIGNAL TO CUT-OFF: 0.01 (ref <1.00)

## 2019-05-22 LAB — IRON,TIBC AND FERRITIN PANEL
%SAT: 13 % (calc) — ABNORMAL LOW (ref 16–45)
Ferritin: 18 ng/mL (ref 16–232)
Iron: 47 ug/dL (ref 40–190)
TIBC: 351 mcg/dL (calc) (ref 250–450)

## 2019-05-22 LAB — VITAMIN D 25 HYDROXY (VIT D DEFICIENCY, FRACTURES): Vit D, 25-Hydroxy: 26 ng/mL — ABNORMAL LOW (ref 30–100)

## 2019-10-11 DIAGNOSIS — R350 Frequency of micturition: Secondary | ICD-10-CM | POA: Diagnosis not present

## 2019-10-11 DIAGNOSIS — R3915 Urgency of urination: Secondary | ICD-10-CM | POA: Diagnosis not present

## 2019-10-11 DIAGNOSIS — Z01411 Encounter for gynecological examination (general) (routine) with abnormal findings: Secondary | ICD-10-CM | POA: Diagnosis not present

## 2019-11-19 NOTE — Progress Notes (Deleted)
Patient is a 44 year old female Last visit was 05/21/2019 Communication from the laboratory results after that visit included the following:  The thyroid studies were all normal with no evidence of high or low thyroid. The complete blood count was normal. The complete metabolic panel was also all normal and included your blood sugar, electrolytes, kidney function and liver function tests, and calcium level. The lipid panel was good with the only concern being a slightly low HDL (healthy type) cholesterol level at 43. Increasing physical activity levels can help increase this The hepatitis C antibody screen was nonreactive The iron panel showed that your iron and ferritin (best measure of your iron stores) were at the very low end of normal, and your iron percent saturation was low. I do think that adding an over-the-counter strength iron supplement would be helpful, with a once a day vitamin with iron helpful. Can check these studies again in several months, and if not improving, may need a formal prescription iron supplement (sometimes can constipate and make the stools darker, and would not rush to taking this presently) The vitamin D level was slightly low at 26, and I would recommend taking a vitamin D over-the-counter supplement, with a vitamin D3 supplement with 800 to 1000 international units recommended daily. Can also check the studies again in several months to help reassess. Follows up today  To review:  Fatigue  -last visit noted feeling tired all the time, low energy levels at times.  She notes this started after her last child was born, who is now approximately 42 years old.  She did not ever lose weight from that pregnancy as she desired, and struggled to try to lose weight in the postpartum period despite being very active. She states she is sleeping okay, gets to sleep quickly, tries to get to bed by 10:00 at night.  She is up 3 times a week at approximately 4 or 430, only getting 6+  hours on most nights, tries to get a little more on other days.  She notes she does snore, again noted it was after her last child was born. 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  She also is quite busy with care of her children. She states she has had lab work done with her prior evaluations, with no obvious findings to help in the source of the chronic fatigue.  She noted some concerns with her family history and endocrine issues including thyroid disease, and mom had a pituitary tumor with a knife surgery done to manage, also mom had a brain tumor in her past, and she does have a uncle with diabetes.  She has no personal history of diabetes or thyroid disease. She notes she takes a lot of vitamins, although when was recommended to take high-dose of a vitamin D in the past, she did not do so, and noted she preferred to just try and drink more milk. Past medical issues recently noted following have included:  Vitamin D deficiency  As above noted, did not have replacement therapy when diagnosed Tobacco use Quit tobacco use September, 2020 and has not returned.   Family history of other endocrine, nutritional and metabolic diseases  As above noted   Migraine, without status migrainosus, she notes her headaches have been a little more frequent in the past couple weeks, she thinks may be related to the  allergens, and has been taking Allegra-D daily to help.  She notes she does not tolerate the nasal sprays very well, as I noted Flonase as an option.  GERD -she was on Pepcid, and recently changed to a Nexium product, and notes that Nexium once a day is working better than the Pepcid twice a day.  It controls her reflux throughout the day.   H/o abnormal PAP: Last PAP was 09/29/2018 and had normal cytology and HPV neg Gyn history prior:         Is gravida 6 para 22 November 2002  negative Pap smear        May 2006 low-grade dysplasia HPV negative.        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 D&C, Benign with inflamed endocervix endometrial biopsy was benign        2/14 normal pap smear        09/2014 - normal PAP  Last mammogram - 09/12/2018 (prior to breast surgery) Last visit with OB/Gyn 11/16/2018 - IUD was inserted 09/2018 - Mirena  On a prior visit with gyn, it was noted that "She is asking for more holistic provider that she does not want to take medications necessarily for minor ailments."  She did echo today that she is careful with taking medications when possible.  Tob - quit smoking 10/14/2018 (prior to breast surgery) Alcohol-occasional

## 2019-11-21 ENCOUNTER — Ambulatory Visit: Payer: 59 | Admitting: Internal Medicine

## 2019-11-21 DIAGNOSIS — E663 Overweight: Secondary | ICD-10-CM

## 2019-11-21 DIAGNOSIS — R748 Abnormal levels of other serum enzymes: Secondary | ICD-10-CM

## 2019-11-21 DIAGNOSIS — K219 Gastro-esophageal reflux disease without esophagitis: Secondary | ICD-10-CM

## 2019-11-21 DIAGNOSIS — E559 Vitamin D deficiency, unspecified: Secondary | ICD-10-CM

## 2019-11-21 DIAGNOSIS — Z8349 Family history of other endocrine, nutritional and metabolic diseases: Secondary | ICD-10-CM

## 2019-11-21 DIAGNOSIS — E611 Iron deficiency: Secondary | ICD-10-CM

## 2019-11-21 DIAGNOSIS — R5382 Chronic fatigue, unspecified: Secondary | ICD-10-CM

## 2020-09-30 ENCOUNTER — Other Ambulatory Visit: Payer: Self-pay

## 2020-09-30 MED ORDER — CARESTART COVID-19 HOME TEST VI KIT
PACK | 0 refills | Status: DC
  Filled 2020-09-30: qty 2, 4d supply, fill #0

## 2020-12-03 ENCOUNTER — Other Ambulatory Visit: Payer: Self-pay

## 2020-12-04 ENCOUNTER — Other Ambulatory Visit: Payer: Self-pay

## 2020-12-04 MED ORDER — CARESTART COVID-19 HOME TEST VI KIT
PACK | 0 refills | Status: DC
  Filled 2020-12-04: qty 2, 4d supply, fill #0

## 2021-01-06 ENCOUNTER — Other Ambulatory Visit: Payer: Self-pay

## 2021-01-06 MED ORDER — CARESTART COVID-19 HOME TEST VI KIT
PACK | 0 refills | Status: DC
Start: 2021-01-06 — End: 2021-02-11
  Filled 2021-01-06: qty 2, 4d supply, fill #0

## 2021-02-11 ENCOUNTER — Other Ambulatory Visit: Payer: Self-pay

## 2021-02-11 MED ORDER — CARESTART COVID-19 HOME TEST VI KIT
PACK | 0 refills | Status: DC
  Filled 2021-02-11: qty 2, 4d supply, fill #0

## 2021-02-26 DIAGNOSIS — B9789 Other viral agents as the cause of diseases classified elsewhere: Secondary | ICD-10-CM | POA: Diagnosis not present

## 2021-02-26 DIAGNOSIS — R0981 Nasal congestion: Secondary | ICD-10-CM | POA: Diagnosis not present

## 2021-02-26 DIAGNOSIS — J019 Acute sinusitis, unspecified: Secondary | ICD-10-CM | POA: Diagnosis not present

## 2021-05-21 ENCOUNTER — Other Ambulatory Visit (HOSPITAL_COMMUNITY)
Admission: RE | Admit: 2021-05-21 | Discharge: 2021-05-21 | Disposition: A | Discharge status: Home / Self Care | Payer: No Typology Code available for payment source | Source: Ambulatory Visit | Attending: Obstetrics & Gynecology | Admitting: Obstetrics & Gynecology

## 2021-05-21 ENCOUNTER — Encounter: Payer: Self-pay | Admitting: Obstetrics & Gynecology

## 2021-05-21 ENCOUNTER — Ambulatory Visit (INDEPENDENT_AMBULATORY_CARE_PROVIDER_SITE_OTHER): Payer: No Typology Code available for payment source | Admitting: Obstetrics & Gynecology

## 2021-05-21 VITALS — BP 122/85 | HR 87 | Ht 65.0 in | Wt 174.4 lb

## 2021-05-21 DIAGNOSIS — Z01419 Encounter for gynecological examination (general) (routine) without abnormal findings: Secondary | ICD-10-CM | POA: Diagnosis not present

## 2021-05-21 DIAGNOSIS — Z1329 Encounter for screening for other suspected endocrine disorder: Secondary | ICD-10-CM | POA: Diagnosis not present

## 2021-05-21 DIAGNOSIS — Z1231 Encounter for screening mammogram for malignant neoplasm of breast: Secondary | ICD-10-CM | POA: Diagnosis not present

## 2021-05-21 DIAGNOSIS — Z13220 Encounter for screening for lipoid disorders: Secondary | ICD-10-CM

## 2021-05-21 DIAGNOSIS — Z975 Presence of (intrauterine) contraceptive device: Secondary | ICD-10-CM

## 2021-05-21 DIAGNOSIS — Z131 Encounter for screening for diabetes mellitus: Secondary | ICD-10-CM

## 2021-05-21 NOTE — Progress Notes (Signed)
? ?WELL-WOMAN EXAMINATION ?Patient name: [name] MRN [id number]  Date of birth: 03/24/1975 ?Chief Complaint:   ?New Patient (Initial Visit) and Gynecologic Exam ? ?History of Present Illness:   ?[name] is a 46 y.o. [phone]  female being seen today for a routine well-woman exam.  ? ?Prior [facility] patient seen in 2021 ? ?Mirena placed- 09/2018- no menses.  Occasional brown spotting ? ?Previously noted OAB symptoms; tried mybetriq for only a fe wdays.  Since she is now working from home this is not an issue as she can go to the bathroom when needed. ? ?Notes long-standing issue of fatigue.  Previously received vitamin transfusion, but they did not accept insurance.  Overall doing well and reports no acute complaints. ? ?PCP in [place], but he recently left, in the process of finding someone new.   ? ? ?No LMP recorded (lmp unknown). (Menstrual status: IUD). ? ?Last pap 2019.  ?Last mammogram: 2020- likely [facility]. ?Last colonoscopy: n/a ? ? ?  05/21/2021  ?  8:58 AM 05/21/2019  ?  8:38 AM  ?Depression screen PHQ 2/9  ?Decreased Interest 2 0  ?Down, Depressed, Hopeless 1 0  ?PHQ - 2 Score 3 0  ?Altered sleeping 2 0  ?Tired, decreased energy 3 2  ?Change in appetite 3 0  ?Feeling bad or failure about yourself  2 0  ?Trouble concentrating 0 0  ?Moving slowly or fidgety/restless 0 0  ?Suicidal thoughts 0 0  ?PHQ-9 Score 13 2  ?Difficult doing work/chores  Not difficult at all  ? ? ? ? ?Review of Systems:   ?Pertinent items are noted in HPI ?Denies any headaches, blurred vision, fatigue, shortness of breath, chest pain, abdominal pain, bowel movements, urination, or intercourse unless otherwise stated above. ? ?Pertinent History Reviewed:  ?Reviewed past medical,surgical, social and family history.  ?Reviewed problem list, medications and allergies. ?Physical Assessment:  ? ?Vitals:  ? 05/21/21 0900  ?BP: 122/85  ?Pulse: 87  ?Weight: 174 lb 6.4 oz (79.1 kg)  ?Height: _0  (1.651 m)  ?Body mass index is 29.02  kg/m?. ?  ?     Physical Examination:  ? General appearance - well appearing, and in no distress ? Mental status - alert, oriented to person, place, and time ? Psych:  She has a normal mood and affect ? Skin - warm and dry, normal color, no suspicious lesions noted ? Chest - effort normal, all lung fields clear to auscultation bilaterally ? Heart - normal rate and regular rhythm ? Neck:  midline trachea, no thyromegaly or nodules ? Breasts - breasts appear normal, no suspicious masses, no skin or nipple changes or  axillary nodes ? Abdomen - soft, nontender, nondistended, no masses or organomegaly ? Pelvic - VULVA: normal appearing vulva with no masses, tenderness or lesions  VAGINA: normal appearing vagina with normal color and discharge, no lesions  CERVIX: normal appearing cervix without discharge or lesions, no CMT, strings visualized ? Thin prep pap is done with HR HPV cotesting ? UTERUS: uterus is felt to be normal size, shape, consistency and nontender  ? ADNEXA: No adnexal masses or tenderness noted. ? Extremities:  No swelling or varicosities noted ? ?Chaperone:  pt declined    ? ? ?Assessment & Plan:  ?1) Well-Woman Exam ?-pap collected, reviewed screening guidelines ?-mammogram ordered ?-lab work to be completed today ? ?2) Mirena IUD ?-in place, no issues ? ?Orders Placed This Encounter  ?Procedures  ? MM 3D SCREEN BREAST BILATERAL  ?  TSH  ? CBC  ? Comp Met (CMET)  ? HgB A1c  ? Lipid Profile  ? Vitamin D 1,25 dihydroxy  ? ? ?Meds: No orders of the defined types were placed in this encounter. ? ? ?Follow-up: Return in about 1 year (around 05/22/2022) for records from [facility] for mammogram, Annual. ? ? ?Janyth Pupa, DO ?Attending [place] ?[facility] ? ? ?

## 2021-05-26 LAB — CYTOLOGY - PAP
Adequacy: ABSENT
Comment: NEGATIVE
Comment: NEGATIVE
Comment: NEGATIVE
Diagnosis: UNDETERMINED — AB
HPV 16: NEGATIVE
HPV 18 / 45: POSITIVE — AB
High risk HPV: POSITIVE — AB

## 2021-05-31 LAB — COMPREHENSIVE METABOLIC PANEL
ALT: 11 IU/L (ref 0–32)
AST: 12 IU/L (ref 0–40)
Albumin/Globulin Ratio: 1.7 (ref 1.2–2.2)
Albumin: 4.5 g/dL (ref 3.8–4.8)
Alkaline Phosphatase: 64 IU/L (ref 44–121)
BUN/Creatinine Ratio: 10 (ref 9–23)
BUN: 9 mg/dL (ref 6–24)
Bilirubin Total: 0.5 mg/dL (ref 0.0–1.2)
CO2: 22 mmol/L (ref 20–29)
Calcium: 9.2 mg/dL (ref 8.7–10.2)
Chloride: 103 mmol/L (ref 96–106)
Creatinine, Ser: 0.87 mg/dL (ref 0.57–1.00)
Globulin, Total: 2.6 g/dL (ref 1.5–4.5)
Glucose: 89 mg/dL (ref 70–99)
Potassium: 3.8 mmol/L (ref 3.5–5.2)
Sodium: 138 mmol/L (ref 134–144)
Total Protein: 7.1 g/dL (ref 6.0–8.5)
eGFR: 85 mL/min/{1.73_m2} (ref >59)

## 2021-05-31 LAB — CBC
Hematocrit: 41.2 % (ref 34.0–46.6)
Hemoglobin: 14 g/dL (ref 11.1–15.9)
MCH: 31 pg (ref 26.6–33.0)
MCHC: 34 g/dL (ref 31.5–35.7)
MCV: 91 fL (ref 79–97)
Platelets: 263 10*3/uL (ref 150–450)
RBC: 4.52 x10E6/uL (ref 3.77–5.28)
RDW: 13.3 % (ref 11.7–15.4)
WBC: 8.4 10*3/uL (ref 3.4–10.8)

## 2021-05-31 LAB — LIPID PANEL
Chol/HDL Ratio: 4.9 ratio — ABNORMAL HIGH (ref 0.0–4.4)
Cholesterol, Total: 181 mg/dL (ref 100–199)
HDL: 37 mg/dL — ABNORMAL LOW (ref >39)
LDL Chol Calc (NIH): 120 mg/dL — ABNORMAL HIGH (ref 0–99)
Triglycerides: 132 mg/dL (ref 0–149)
VLDL Cholesterol Cal: 24 mg/dL (ref 5–40)

## 2021-05-31 LAB — VITAMIN D 1,25 DIHYDROXY
Vitamin D 1, 25 (OH)2 Total: 46 pg/mL
Vitamin D2 1, 25 (OH)2: <10 pg/mL
Vitamin D3 1, 25 (OH)2: 46 pg/mL

## 2021-05-31 LAB — HEMOGLOBIN A1C
Est. average glucose Bld gHb Est-mCnc: 103 mg/dL
Hgb A1c MFr Bld: 5.2 % (ref 4.8–5.6)

## 2021-05-31 LAB — TSH: TSH: 1.87 u[IU]/mL (ref 0.450–4.500)

## 2021-06-30 ENCOUNTER — Encounter: Payer: No Typology Code available for payment source | Admitting: Obstetrics & Gynecology

## 2021-07-12 ENCOUNTER — Encounter: Payer: No Typology Code available for payment source | Admitting: Obstetrics & Gynecology

## 2021-07-30 ENCOUNTER — Ambulatory Visit (INDEPENDENT_AMBULATORY_CARE_PROVIDER_SITE_OTHER): Payer: No Typology Code available for payment source | Admitting: Obstetrics & Gynecology

## 2021-07-30 ENCOUNTER — Other Ambulatory Visit (HOSPITAL_COMMUNITY)
Admission: RE | Admit: 2021-07-30 | Discharge: 2021-07-30 | Disposition: A | Discharge status: Home / Self Care | Payer: No Typology Code available for payment source | Source: Ambulatory Visit | Attending: Obstetrics & Gynecology | Admitting: Obstetrics & Gynecology

## 2021-07-30 ENCOUNTER — Encounter: Payer: Self-pay | Admitting: Obstetrics & Gynecology

## 2021-07-30 VITALS — BP 132/79 | HR 90 | Wt 174.2 lb

## 2021-07-30 DIAGNOSIS — R87610 Atypical squamous cells of undetermined significance on cytologic smear of cervix (ASC-US): Secondary | ICD-10-CM

## 2021-07-30 DIAGNOSIS — Z3202 Encounter for pregnancy test, result negative: Secondary | ICD-10-CM | POA: Insufficient documentation

## 2021-07-30 DIAGNOSIS — Z01411 Encounter for gynecological examination (general) (routine) with abnormal findings: Secondary | ICD-10-CM | POA: Diagnosis not present

## 2021-07-30 DIAGNOSIS — R87810 Cervical high risk human papillomavirus (HPV) DNA test positive: Secondary | ICD-10-CM

## 2021-07-30 LAB — POCT URINE PREGNANCY: Preg Test, Ur: NEGATIVE

## 2021-07-30 NOTE — Progress Notes (Signed)
Patient name: [name] MRN [id number]  Date of birth: [birth year] Chief Complaint:   Colposcopy  History of Present Illness:   [name] is a 46 y.o. [id number] female being seen today for cervical dysplasia management.  Recent pap completed 05/21/21: ASCUS, HPV 18/45 positive Prior pap 2018 neg  Smoker:  No. Former tobacco  History of abnormal Pap: yes 2013- cold knife cone biopsy  Currently as IUD- denies issues with menses.  Denies intermenstrual or postcoital bleeding.  Denies irregular discharge, itching or irritation.  Denies pelvic or abodminal pain.  No acute complaints or concerns.  No LMP recorded. (Menstrual status: IUD).     05/21/2021    8:58 AM 05/21/2019    8:38 AM  Depression screen PHQ 2/9  Decreased Interest 2 0  Down, Depressed, Hopeless 1 0  PHQ - 2 Score 3 0  Altered sleeping 2 0  Tired, decreased energy 3 2  Change in appetite 3 0  Feeling bad or failure about yourself  2 0  Trouble concentrating 0 0  Moving slowly or fidgety/restless 0 0  Suicidal thoughts 0 0  PHQ-9 Score 13 2  Difficult doing work/chores  Not difficult at all     Review of Systems:   Pertinent items are noted in HPI Denies fever/chills, dizziness, headaches, visual disturbances, fatigue, shortness of breath, chest pain, abdominal pain, vomiting, no problems with periods, bowel movements, urination, or intercourse unless otherwise stated above.  Pertinent History Reviewed:  Reviewed past medical,surgical, social, obstetrical and family history.  Reviewed problem list, medications and allergies. Physical Assessment:   Vitals:   07/30/21 1009  BP: 132/79  Pulse: 90  Weight: 174 lb 3.2 oz (79 kg)  Body mass index is 28.99 kg/m.       Physical Examination:   General appearance: alert, well appearing, and in no distress  Psych: mood appropriate, normal affect  Skin: warm & dry   Cardiovascular: normal heart rate noted  Respiratory: normal respiratory effort,  no distress  Abdomen: soft, non-tender   Pelvic: VULVA: normal appearing vulva with no masses, tenderness or lesions, VAGINA: normal appearing vagina with normal color and discharge, no lesions, CERVIX: some shortening of cervix noted due to prior cone, IUD strings seen,  see colposcopy section for further details  Extremities: no edema   Chaperone: Jobe Marker     Colposcopy Procedure Note  Indications: ASCUS, HPV 18/45+    Procedure Details  The risks and benefits of the procedure and Written informed consent obtained.  Speculum placed in vagina and excellent visualization of cervix achieved, cervix swabbed x 3 with acetic acid solution.  Findings: Adequate colposcopy is noted today.  TMZ zone present  Cervix: no visible lesions, no mosaicism, no punctation, and no abnormal vasculature; ECC and cervical biopsies obtained.    Monsel's applied.  Adequate hemostasis noted  Specimens: ECC and cervical biopsy obtained @ 6 o'clock  Complications: none.  Colposcopic Impression: CIN 1   Plan(Based on 2019 ASCCP recommendations)  -Discussed HPV- reviewed incidence and its potential to cause condylomas to dysplasia to cervical cancer -Reviewed degree of abnormal pap smears  -Discussed ASCCP guidelines and current recommendations for colposcopy -As above, inform consent obtained and procedure completed -biopsies obtained, further management pending results -Questions and concerns were addressed  -Pt concerned about weight.  Reviewed current BMI and did not think she would meet criteria for referral to weight loss clinic.  Encouraged pt to consider external sources- personal trainer or other  type of program  Myna Hidalgo, DO Attending Obstetrician & Gynecologist, [facility] for [employer], [facility]

## 2021-08-02 LAB — SURGICAL PATHOLOGY

## 2022-02-14 ENCOUNTER — Other Ambulatory Visit: Payer: Self-pay

## 2022-06-16 DIAGNOSIS — Z1231 Encounter for screening mammogram for malignant neoplasm of breast: Secondary | ICD-10-CM | POA: Diagnosis not present

## 2022-06-20 ENCOUNTER — Telehealth: Payer: Self-pay | Admitting: Obstetrics & Gynecology

## 2022-06-20 NOTE — Telephone Encounter (Signed)
Called pt to review recent mammogram. Discussed incomplete findings and recommendation for follow-up imaging.  Patient at this time declined follow-up imaging.  She notes that if there is breast cancer there that they will catch it next year  Rose Hidalgo, DO Attending Obstetrician & Gynecologist, [facility] for [employer], [facility]

## 2022-09-30 ENCOUNTER — Encounter: Payer: Self-pay | Admitting: Obstetrics & Gynecology

## 2022-09-30 ENCOUNTER — Other Ambulatory Visit (HOSPITAL_COMMUNITY)
Admission: RE | Admit: 2022-09-30 | Discharge: 2022-09-30 | Disposition: A | Discharge status: Home / Self Care | Payer: BC Managed Care – PPO | Source: Ambulatory Visit | Attending: Obstetrics & Gynecology | Admitting: Obstetrics & Gynecology

## 2022-09-30 ENCOUNTER — Ambulatory Visit (INDEPENDENT_AMBULATORY_CARE_PROVIDER_SITE_OTHER): Payer: BC Managed Care – PPO | Admitting: Obstetrics & Gynecology

## 2022-09-30 VITALS — BP 136/94 | HR 71 | Ht 65.0 in | Wt 172.0 lb

## 2022-09-30 DIAGNOSIS — Z1329 Encounter for screening for other suspected endocrine disorder: Secondary | ICD-10-CM | POA: Diagnosis not present

## 2022-09-30 DIAGNOSIS — Z131 Encounter for screening for diabetes mellitus: Secondary | ICD-10-CM

## 2022-09-30 DIAGNOSIS — Z1321 Encounter for screening for nutritional disorder: Secondary | ICD-10-CM | POA: Diagnosis not present

## 2022-09-30 DIAGNOSIS — Z01419 Encounter for gynecological examination (general) (routine) without abnormal findings: Secondary | ICD-10-CM

## 2022-09-30 DIAGNOSIS — Z13220 Encounter for screening for lipoid disorders: Secondary | ICD-10-CM

## 2022-09-30 DIAGNOSIS — N3281 Overactive bladder: Secondary | ICD-10-CM

## 2022-09-30 DIAGNOSIS — Z975 Presence of (intrauterine) contraceptive device: Secondary | ICD-10-CM

## 2022-09-30 NOTE — Progress Notes (Signed)
WELL-WOMAN EXAMINATION Patient name: [name] MRN [id number]  Date of birth: Jun 23, 1975 Chief Complaint:   Annual Exam  History of Present Illness:   [name] is a 47 y.o. [id number] female being seen today for a routine well-woman exam.    Mirena placed 10/15/2018-patient had card to confirm dates.  She denies regular periods, on occasion may have some light brown spotting Notes some irregular discharge- notes a white discharge. No itching, no burning, no odor.  Denies pelvic or abdominal pain.   OAB: +urgency, urge incontinence-tried medication in the past and noted that she only voided twice per day which she did not like.  She would prefer to avoid medication and is not interested in treatment at this time   No LMP recorded. (Menstrual status: IUD). Denies issues with her menses The current method of family planning is IUD.    Last pap ASCUS, HPV+ > CIN 1 []  rpt pap due today.  Last mammogram: 07/2022. Last colonoscopy: NA     05/21/2021    8:58 AM 05/21/2019    8:38 AM  Depression screen PHQ 2/9  Decreased Interest 2 0  Down, Depressed, Hopeless 1 0  PHQ - 2 Score 3 0  Altered sleeping 2 0  Tired, decreased energy 3 2  Change in appetite 3 0  Feeling bad or failure about yourself  2 0  Trouble concentrating 0 0  Moving slowly or fidgety/restless 0 0  Suicidal thoughts 0 0  PHQ-9 Score 13 2  Difficult doing work/chores  Not difficult at all      Review of Systems:   Pertinent items are noted in HPI Denies any headaches, blurred vision, fatigue, shortness of breath, chest pain, abdominal pain, bowel movements, or intercourse unless otherwise stated above.  Pertinent History Reviewed:  Reviewed past medical,surgical, social and family history.  Reviewed problem list, medications and allergies. Physical Assessment:   Vitals:   09/30/22 1105 09/30/22 1109  BP: (!) 141/86 (!) 136/94  Pulse: 68 71  Weight: 172 lb (78 kg)   Height: 5\' 5"  (1.651 m)    Body mass index is 28.62 kg/m.        Physical Examination:   General appearance - well appearing, and in no distress  Mental status - alert, oriented to person, place, and time  Psych:  She has a normal mood and affect  Skin - warm and dry, normal color, no suspicious lesions noted  Chest - effort normal, all lung fields clear to auscultation bilaterally  Heart - normal rate and regular rhythm  Neck:  midline trachea, no thyromegaly or nodules  Breasts - breasts appear normal, no suspicious masses, no skin or nipple changes or  axillary nodes  Abdomen - soft, nontender, nondistended, no masses or organomegaly  Pelvic - VULVA: normal appearing vulva with no masses, tenderness or lesions  VAGINA: normal appearing vagina with normal color and discharge, no lesions  CERVIX: normal appearing cervix without discharge or lesions, no CMT  Thin prep pap is done with HR HPV cotesting  UTERUS: uterus is felt to be normal size, shape, consistency and nontender   ADNEXA: No adnexal masses or tenderness noted.   Extremities:  No swelling or varicosities noted  Chaperone:  Sherri Kaywood      Assessment & Plan:  1) Well-Woman Exam -Pap collected due to prior dysplasia -Mammogram up-to-date -IUD in place, and due for replacement in 2028  2) OAB -Declined medical management   3) elevated blood  pressure -Patient still has not found PCP out in [place] -Long discussion regarding lifestyle changes versus medications -Patient would prefer to avoid medication when possible  4) thyroid vitamin D cholesterol and diabetic screening -Lab work ordered  Orders Placed This Encounter  Procedures   CBC   Comprehensive metabolic panel   Lipid panel   TSH   VITAMIN D 25 Hydroxy (Vit-D Deficiency, Fractures)   HgB A1c    Meds: No orders of the defined types were placed in this encounter.   Follow-up: Return in about 1 year (around 09/30/2023) for Annual.   Myna Hidalgo, DO Attending  Obstetrician & Gynecologist, [facility] for [facility], [facility]

## 2022-10-01 LAB — LIPID PANEL
Chol/HDL Ratio: 4.6 ratio — ABNORMAL HIGH (ref 0.0–4.4)
Cholesterol, Total: 193 mg/dL (ref 100–199)
HDL: 42 mg/dL (ref >39)
LDL Chol Calc (NIH): 129 mg/dL — ABNORMAL HIGH (ref 0–99)
Triglycerides: 119 mg/dL (ref 0–149)
VLDL Cholesterol Cal: 22 mg/dL (ref 5–40)

## 2022-10-01 LAB — COMPREHENSIVE METABOLIC PANEL
ALT: 10 IU/L (ref 0–32)
AST: 12 IU/L (ref 0–40)
Albumin: 4.5 g/dL (ref 3.9–4.9)
Alkaline Phosphatase: 62 IU/L (ref 44–121)
BUN/Creatinine Ratio: 15 (ref 9–23)
BUN: 12 mg/dL (ref 6–24)
Bilirubin Total: 0.3 mg/dL (ref 0.0–1.2)
CO2: 21 mmol/L (ref 20–29)
Calcium: 9.5 mg/dL (ref 8.7–10.2)
Chloride: 103 mmol/L (ref 96–106)
Creatinine, Ser: 0.81 mg/dL (ref 0.57–1.00)
Globulin, Total: 2.6 g/dL (ref 1.5–4.5)
Glucose: 89 mg/dL (ref 70–99)
Potassium: 4 mmol/L (ref 3.5–5.2)
Sodium: 140 mmol/L (ref 134–144)
Total Protein: 7.1 g/dL (ref 6.0–8.5)
eGFR: 92 mL/min/{1.73_m2} (ref >59)

## 2022-10-01 LAB — CBC
Hematocrit: 42.1 % (ref 34.0–46.6)
Hemoglobin: 13.8 g/dL (ref 11.1–15.9)
MCH: 30.7 pg (ref 26.6–33.0)
MCHC: 32.8 g/dL (ref 31.5–35.7)
MCV: 94 fL (ref 79–97)
Platelets: 256 10*3/uL (ref 150–450)
RBC: 4.5 x10E6/uL (ref 3.77–5.28)
RDW: 13.3 % (ref 11.7–15.4)
WBC: 8.7 10*3/uL (ref 3.4–10.8)

## 2022-10-01 LAB — TSH: TSH: 1.9 u[IU]/mL (ref 0.450–4.500)

## 2022-10-01 LAB — HEMOGLOBIN A1C
Est. average glucose Bld gHb Est-mCnc: 108 mg/dL
Hgb A1c MFr Bld: 5.4 % (ref 4.8–5.6)

## 2022-10-01 LAB — VITAMIN D 25 HYDROXY (VIT D DEFICIENCY, FRACTURES): Vit D, 25-Hydroxy: 51.1 ng/mL (ref 30.0–100.0)

## 2022-10-06 LAB — CYTOLOGY - PAP
Adequacy: ABSENT
Comment: NEGATIVE
Diagnosis: UNDETERMINED — AB
High risk HPV: NEGATIVE

## 2022-10-17 ENCOUNTER — Other Ambulatory Visit: Payer: Self-pay | Admitting: Obstetrics & Gynecology

## 2022-10-17 DIAGNOSIS — B9689 Other specified bacterial agents as the cause of diseases classified elsewhere: Secondary | ICD-10-CM

## 2022-10-17 MED ORDER — METRONIDAZOLE 500 MG PO TABS
500.0000 mg | ORAL_TABLET | Freq: Two times a day (BID) | ORAL | 0 refills | Status: AC
Start: 2022-10-17 — End: 2022-10-24

## 2022-10-17 MED ORDER — FLUCONAZOLE 150 MG PO TABS: ORAL_TABLET | ORAL | 0 refills | Status: AC

## 2022-10-17 NOTE — Progress Notes (Unsigned)
Rx sent in for BV and yeast noted on pap smear

## 2023-06-21 DIAGNOSIS — H04123 Dry eye syndrome of bilateral lacrimal glands: Secondary | ICD-10-CM | POA: Diagnosis not present

## 2023-10-25 DIAGNOSIS — Z0000 Encounter for general adult medical examination without abnormal findings: Secondary | ICD-10-CM | POA: Diagnosis not present

## 2023-10-25 DIAGNOSIS — Z131 Encounter for screening for diabetes mellitus: Secondary | ICD-10-CM | POA: Diagnosis not present

## 2023-10-25 DIAGNOSIS — Z2821 Immunization not carried out because of patient refusal: Secondary | ICD-10-CM | POA: Diagnosis not present

## 2023-10-25 DIAGNOSIS — Z13220 Encounter for screening for lipoid disorders: Secondary | ICD-10-CM | POA: Diagnosis not present

## 2023-10-25 DIAGNOSIS — R5383 Other fatigue: Secondary | ICD-10-CM | POA: Diagnosis not present

## 2023-10-25 DIAGNOSIS — E538 Deficiency of other specified B group vitamins: Secondary | ICD-10-CM | POA: Diagnosis not present

## 2023-10-25 DIAGNOSIS — Z1331 Encounter for screening for depression: Secondary | ICD-10-CM | POA: Diagnosis not present

## 2023-11-22 DIAGNOSIS — R87610 Atypical squamous cells of undetermined significance on cytologic smear of cervix (ASC-US): Secondary | ICD-10-CM | POA: Diagnosis not present

## 2023-11-22 DIAGNOSIS — Z01419 Encounter for gynecological examination (general) (routine) without abnormal findings: Secondary | ICD-10-CM | POA: Diagnosis not present

## 2023-11-22 DIAGNOSIS — Z124 Encounter for screening for malignant neoplasm of cervix: Secondary | ICD-10-CM | POA: Diagnosis not present

## 2023-11-22 DIAGNOSIS — Z1211 Encounter for screening for malignant neoplasm of colon: Secondary | ICD-10-CM | POA: Diagnosis not present

## 2023-11-24 ENCOUNTER — Other Ambulatory Visit: Payer: Self-pay | Admitting: Certified Nurse Midwife

## 2023-11-24 DIAGNOSIS — Z1231 Encounter for screening mammogram for malignant neoplasm of breast: Secondary | ICD-10-CM
